# Patient Record
Sex: Male | Born: 1980 | Race: White | Hispanic: No | Marital: Married | Smoking: Never smoker
Health system: Southern US, Community
[De-identification: ages and names within clinical notes are randomized; demographics above are authoritative.]

## PROBLEM LIST (undated history)

## (undated) DIAGNOSIS — I469 Cardiac arrest, cause unspecified: Secondary | ICD-10-CM

## (undated) DIAGNOSIS — Z95810 Presence of automatic (implantable) cardiac defibrillator: Secondary | ICD-10-CM

## (undated) DIAGNOSIS — K279 Peptic ulcer, site unspecified, unspecified as acute or chronic, without hemorrhage or perforation: Secondary | ICD-10-CM

## (undated) DIAGNOSIS — I509 Heart failure, unspecified: Secondary | ICD-10-CM

## (undated) DIAGNOSIS — T827XXA Infection and inflammatory reaction due to other cardiac and vascular devices, implants and grafts, initial encounter: Secondary | ICD-10-CM

## (undated) HISTORY — PX: PACEMAKER INSERTION: SHX728

---

## 2004-11-19 DIAGNOSIS — I469 Cardiac arrest, cause unspecified: Secondary | ICD-10-CM

## 2004-11-19 HISTORY — DX: Cardiac arrest, cause unspecified: I46.9

## 2011-12-24 ENCOUNTER — Encounter (HOSPITAL_BASED_OUTPATIENT_CLINIC_OR_DEPARTMENT_OTHER): Payer: Self-pay | Admitting: *Deleted

## 2011-12-24 ENCOUNTER — Emergency Department (INDEPENDENT_AMBULATORY_CARE_PROVIDER_SITE_OTHER): Payer: BC Managed Care – PPO

## 2011-12-24 DIAGNOSIS — M25539 Pain in unspecified wrist: Secondary | ICD-10-CM | POA: Insufficient documentation

## 2011-12-24 DIAGNOSIS — M65849 Other synovitis and tenosynovitis, unspecified hand: Secondary | ICD-10-CM | POA: Insufficient documentation

## 2011-12-24 DIAGNOSIS — M65839 Other synovitis and tenosynovitis, unspecified forearm: Secondary | ICD-10-CM | POA: Insufficient documentation

## 2011-12-24 NOTE — ED Notes (Signed)
C/o left wrist pain since earlier this afternoon, denies injury

## 2011-12-25 ENCOUNTER — Emergency Department (HOSPITAL_BASED_OUTPATIENT_CLINIC_OR_DEPARTMENT_OTHER)
Admission: EM | Admit: 2011-12-25 | Discharge: 2011-12-25 | Disposition: A | Discharge status: Home / Self Care | Payer: BC Managed Care – PPO | Attending: Emergency Medicine | Admitting: Emergency Medicine

## 2011-12-25 DIAGNOSIS — M778 Other enthesopathies, not elsewhere classified: Secondary | ICD-10-CM

## 2011-12-25 HISTORY — DX: Cardiac arrest, cause unspecified: I46.9

## 2011-12-25 MED ORDER — NAPROXEN SODIUM 550 MG PO TABS: ORAL_TABLET | ORAL | Status: AC

## 2011-12-25 MED ORDER — NAPROXEN 250 MG PO TABS
500.0000 mg | ORAL_TABLET | Freq: Once | ORAL | Status: AC
  Administered 2011-12-25: 500 mg via ORAL
  Filled 2011-12-25: qty 2

## 2011-12-25 NOTE — ED Provider Notes (Signed)
History     CSN: [id number]  Arrival date & time 2012/01/10  2308   First MD Initiated Contact with Patient 12/25/11 0136      Chief Complaint  Patient presents with  . Wrist Pain    (Consider location/radiation/quality/duration/timing/severity/associated sxs/prior treatment) HPI Is a 31 year old white male with a complaint of pain in his left wrist. It has been occurring intermittently for an unspecified time but worsened yesterday. It is worse with lifting, although he denies a specific injury. The pain is minimal at the present time but was moderate to severe at its worst. It is located primarily dorsally at the crease. There is no associated numbness, motor deficit or functional deficit.  Past Medical History  Diagnosis Date  . Cardiac arrest     Past Surgical History  Procedure Date  . Pacemaker insertion     History reviewed. No pertinent family history.  History  Substance Use Topics  . Smoking status: Never Smoker   . Smokeless tobacco: Not on file  . Alcohol Use: No      Review of Systems  All other systems reviewed and are negative.    Allergies  Codeine and Vecuronium  Home Medications  No current outpatient prescriptions on file.  BP 140/95  Pulse 84  Temp(Src) 98.7 F (37.1 C) (Oral)  Resp 18  Ht 6\' 2"  (1.88 m)  Wt 275 lb (124.739 kg)  BMI 35.31 kg/m2  SpO2 100%  Physical Exam General: Well-developed, well-nourished male in no acute distress; appearance consistent with age of record HENT: normocephalic, atraumatic Eyes: Normal appearance Neck: supple Heart: regular rate and rhythm Lungs: Normal respiratory effort and excursion Abdomen: soft; nondistended Extremities: No deformity; full range of motion; pulses normal; mild tenderness on deep palpation of the dorsal left wrist at the flexure; no deficit of tendon function, motor function or sensation in left hand Neurologic: Awake, alert and oriented; motor function intact in all  extremities and symmetric; no facial droop Skin: Warm and dry Psychiatric: Normal mood and affect    ED Course  Procedures (including critical care time)     MDM  Nursing notes and vitals signs, including pulse oximetry, reviewed.  Summary of this visit's results, reviewed by myself:   Imaging Studies: Dg Wrist Complete Left  01-10-2012  *RADIOLOGY REPORT*  Clinical Data: Pain without trauma  LEFT WRIST - COMPLETE 3+ VIEW  Comparison: None.  Findings: Carpal rows intact. Negative for fracture, dislocation, or other acute abnormality.  Normal alignment and mineralization. No significant degenerative change.  Regional soft tissues unremarkable.  IMPRESSION:  Negative  Original Report Authenticated By: Osa Craver, M.D.            Hanley Seamen, MD 12/25/11 [phone]

## 2011-12-25 NOTE — ED Notes (Signed)
Dr. Molpus at bedside. 

## 2013-05-02 IMAGING — CR DG WRIST COMPLETE 3+V*L*
4 series · 4 of 4 positions shown · non-contrast
Comparison: None.

CLINICAL DATA: Pain without trauma

LEFT WRIST - COMPLETE 3+ VIEW

[x wrist pa left]
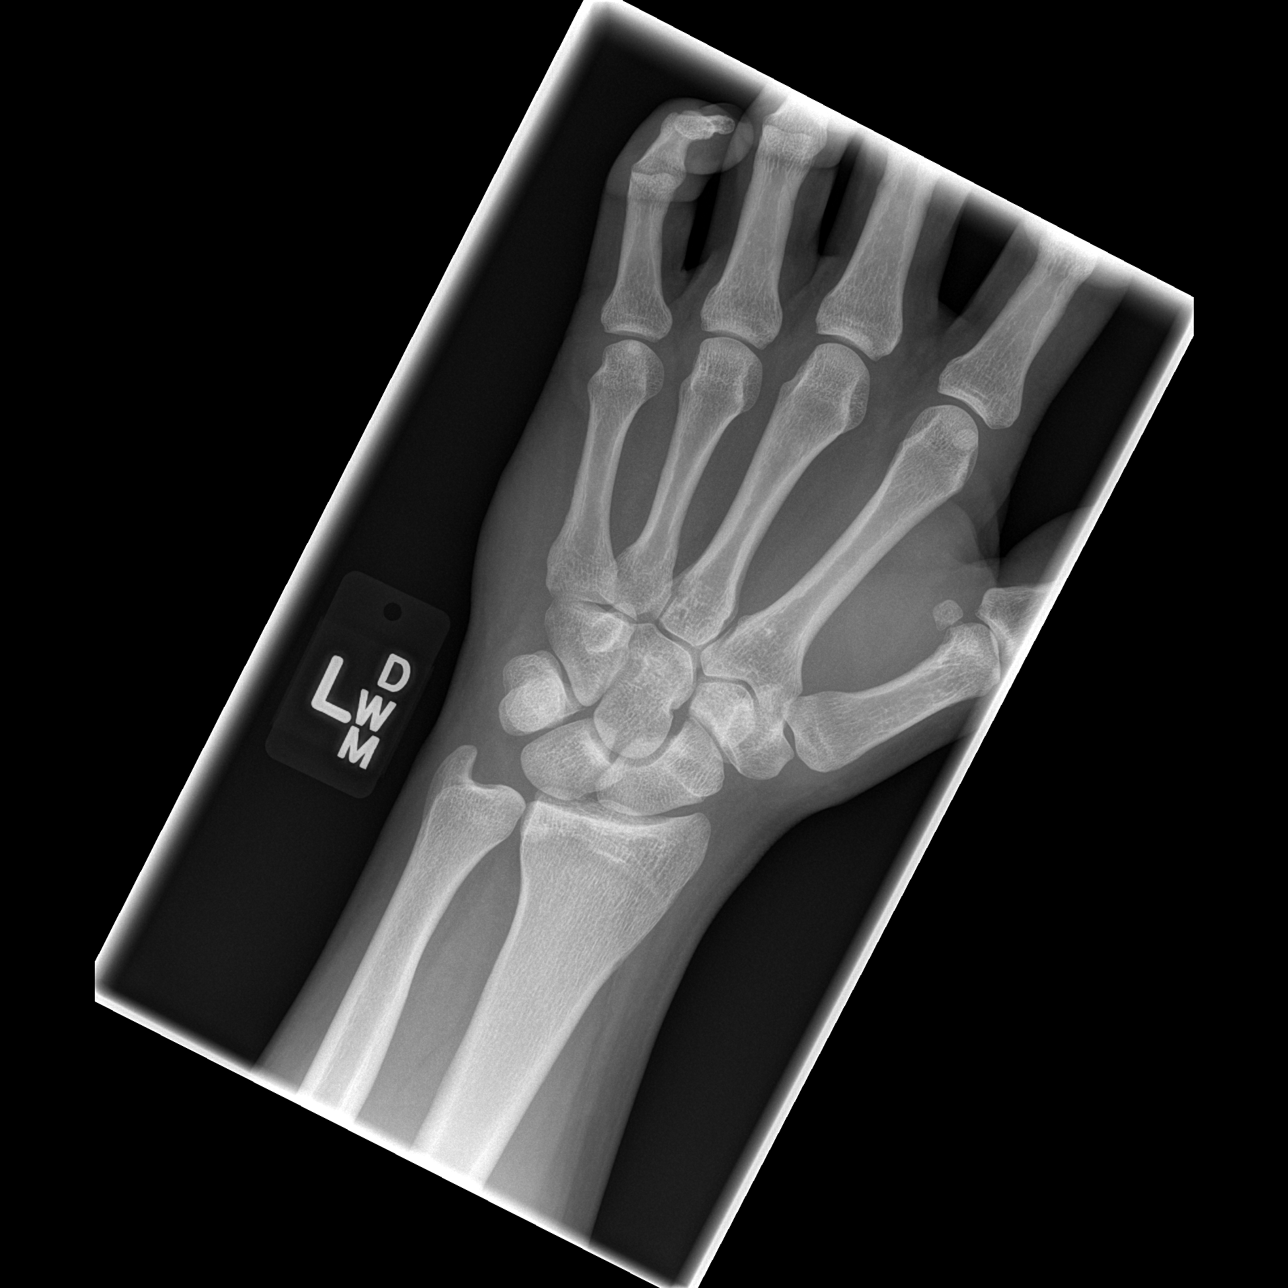

[x wrist obl left]
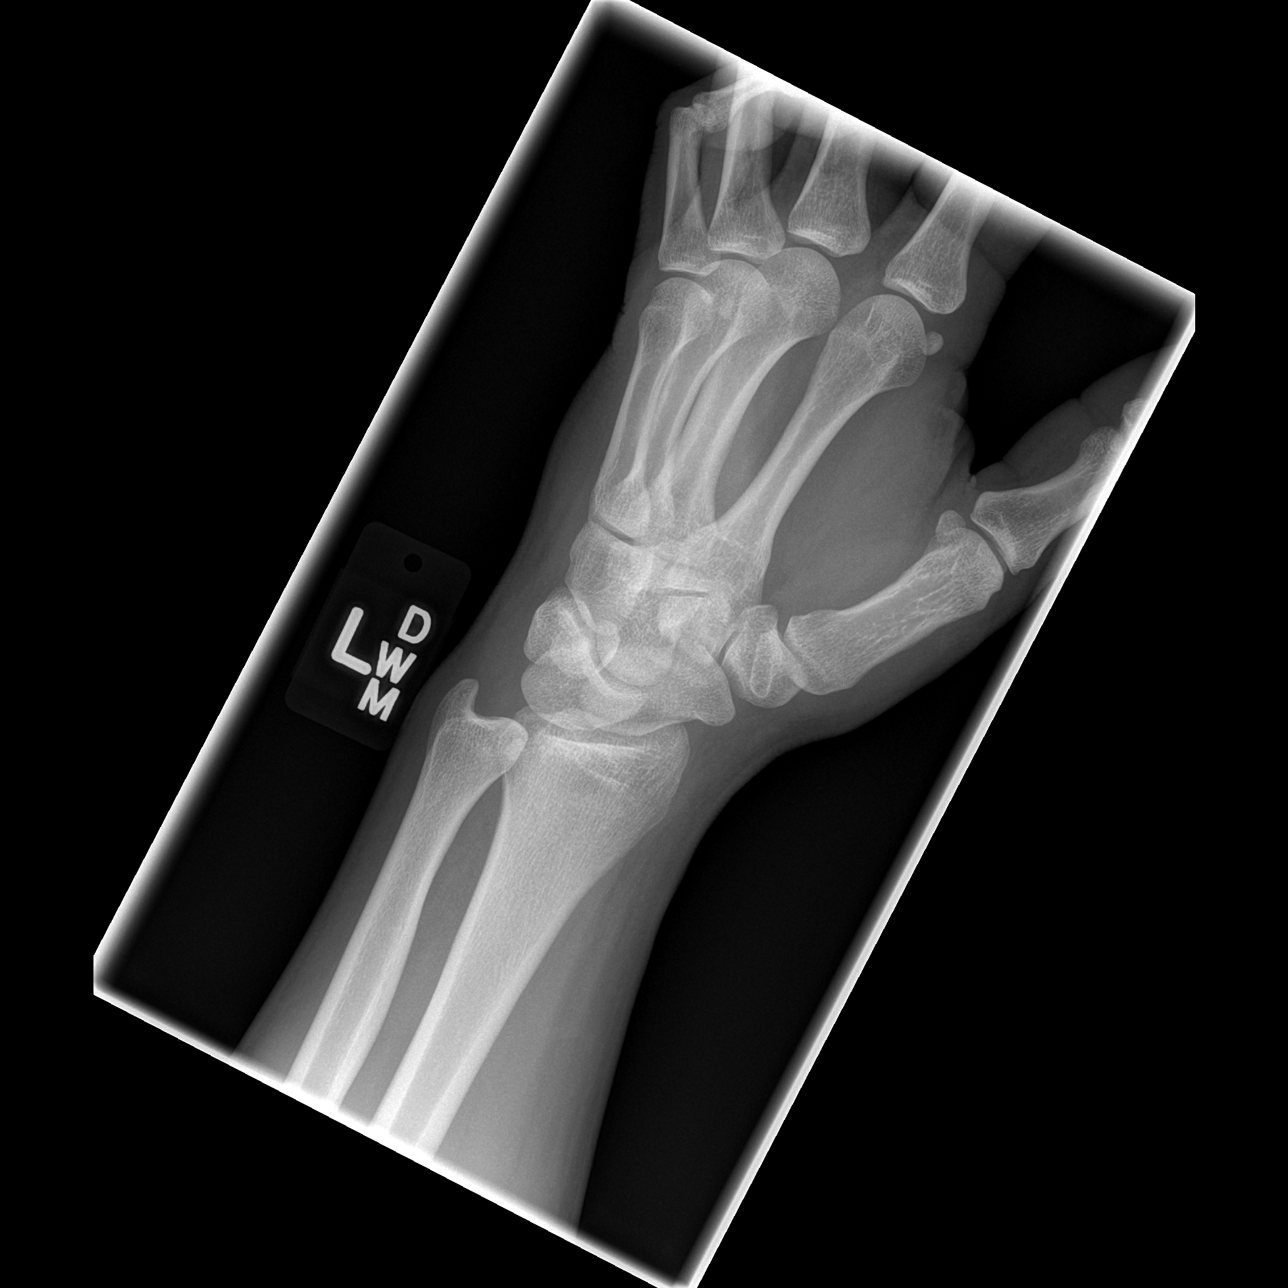

[x wrist lat left]
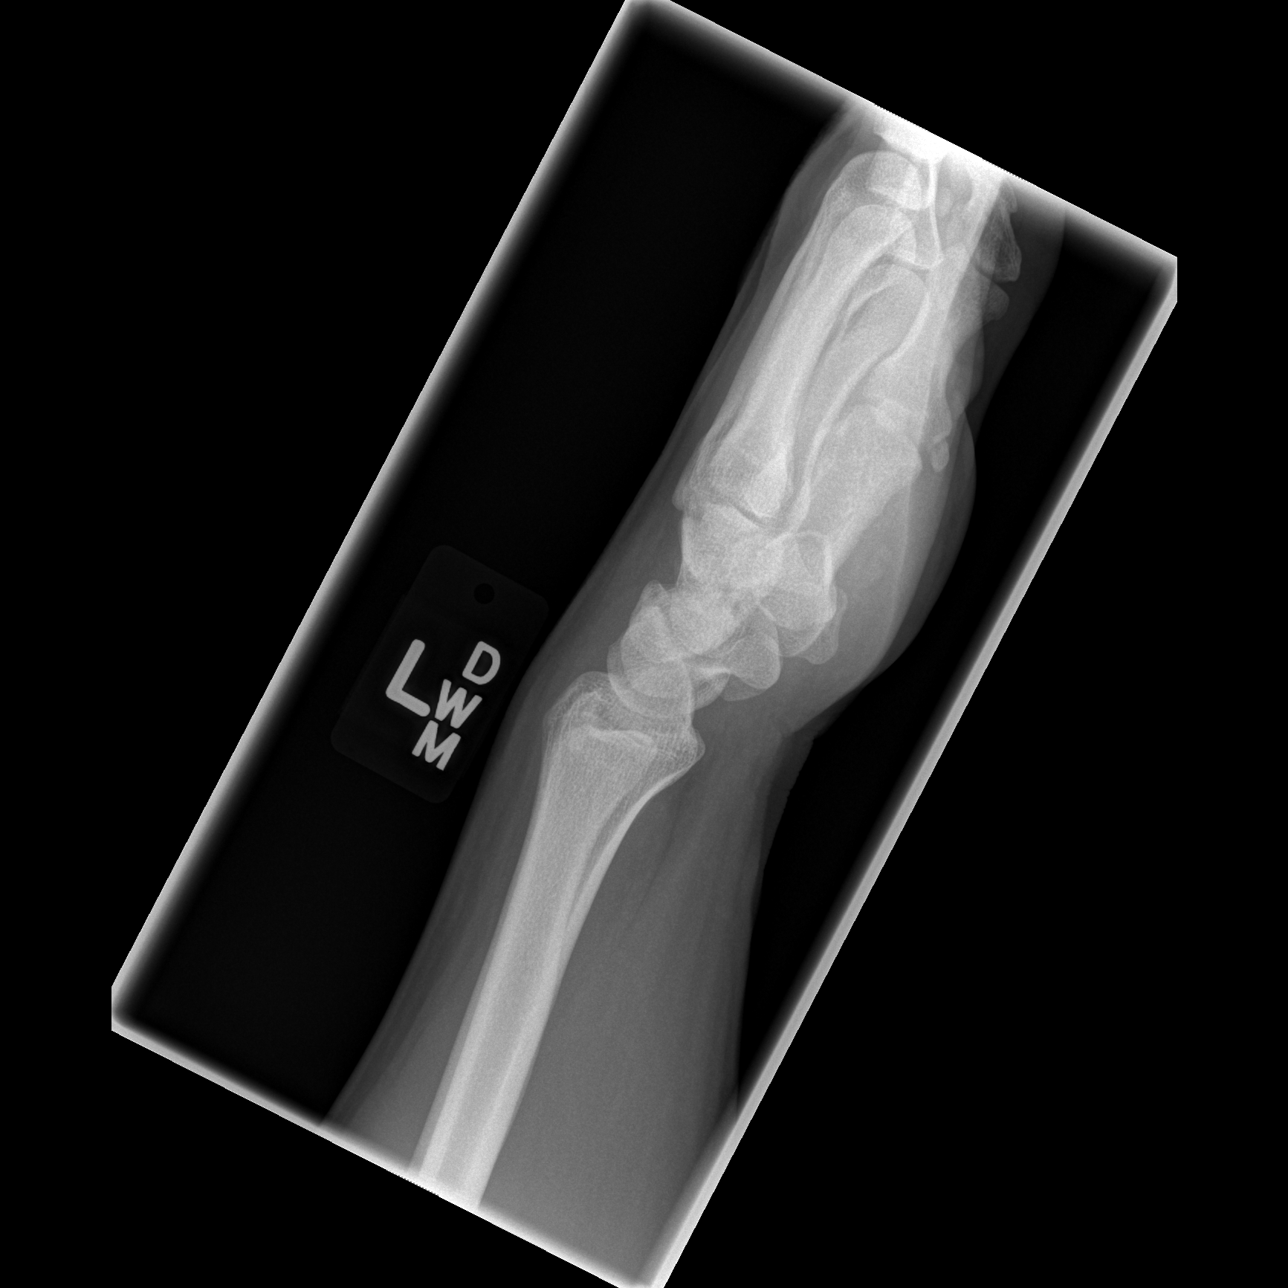

[x navicular]
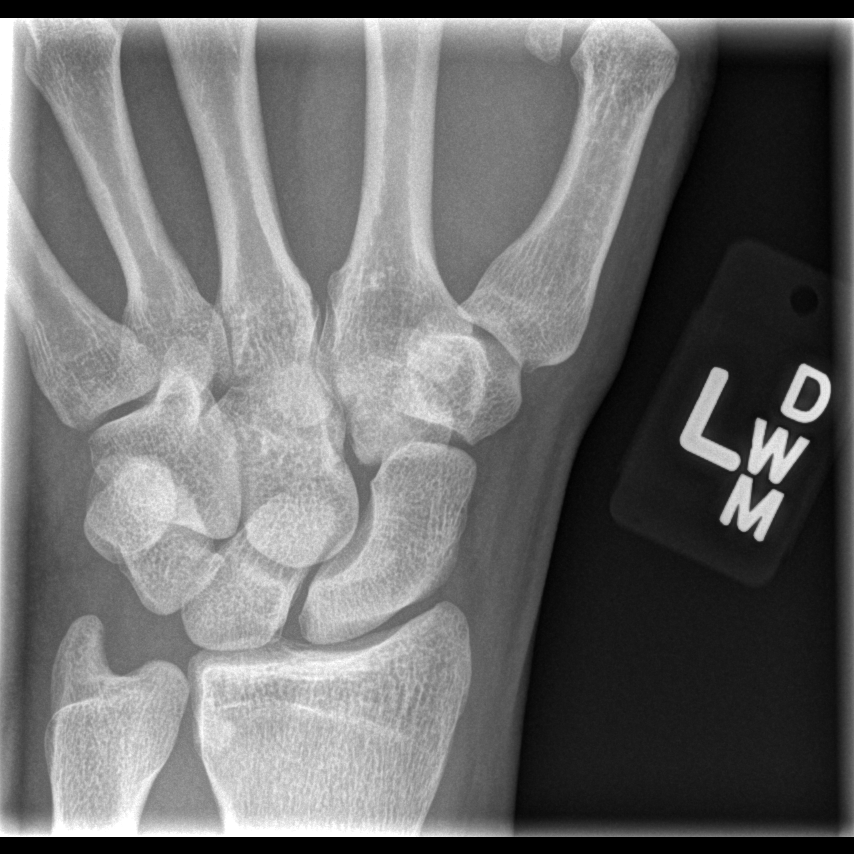

[4 of 4 positions shown; findings below may reference images not displayed]

FINDINGS: Carpal rows intact. Negative for fracture, dislocation,
or other acute abnormality.  Normal alignment and mineralization.
No significant degenerative change.  Regional soft tissues
unremarkable.
IMPRESSION: Negative

## 2014-11-19 HISTORY — PX: OTHER SURGICAL HISTORY: SHX169

## 2014-11-19 HISTORY — PX: THORACOTOMY: SUR1349

## 2017-04-09 ENCOUNTER — Encounter (HOSPITAL_BASED_OUTPATIENT_CLINIC_OR_DEPARTMENT_OTHER): Payer: Self-pay | Admitting: Emergency Medicine

## 2017-04-09 ENCOUNTER — Emergency Department (HOSPITAL_BASED_OUTPATIENT_CLINIC_OR_DEPARTMENT_OTHER): Payer: Medicaid Other

## 2017-04-09 ENCOUNTER — Emergency Department (HOSPITAL_BASED_OUTPATIENT_CLINIC_OR_DEPARTMENT_OTHER)
Admission: EM | Admit: 2017-04-09 | Discharge: 2017-04-09 | Disposition: A | Discharge status: Home / Self Care | Payer: Medicaid Other | Attending: Emergency Medicine | Admitting: Emergency Medicine

## 2017-04-09 DIAGNOSIS — S1011XA Abrasion of throat, initial encounter: Secondary | ICD-10-CM | POA: Diagnosis not present

## 2017-04-09 DIAGNOSIS — Y929 Unspecified place or not applicable: Secondary | ICD-10-CM | POA: Diagnosis not present

## 2017-04-09 DIAGNOSIS — X58XXXA Exposure to other specified factors, initial encounter: Secondary | ICD-10-CM | POA: Insufficient documentation

## 2017-04-09 DIAGNOSIS — S0993XA Unspecified injury of face, initial encounter: Secondary | ICD-10-CM | POA: Diagnosis present

## 2017-04-09 DIAGNOSIS — R0989 Other specified symptoms and signs involving the circulatory and respiratory systems: Secondary | ICD-10-CM | POA: Diagnosis not present

## 2017-04-09 DIAGNOSIS — Y939 Activity, unspecified: Secondary | ICD-10-CM | POA: Diagnosis not present

## 2017-04-09 DIAGNOSIS — Y999 Unspecified external cause status: Secondary | ICD-10-CM | POA: Diagnosis not present

## 2017-04-09 MED ORDER — BENZOCAINE 20 % MT AERO
INHALATION_SPRAY | Freq: Once | OROMUCOSAL | Status: AC
  Administered 2017-04-09: 1 via OROMUCOSAL
  Filled 2017-04-09: qty 57

## 2017-04-09 MED ORDER — DEXAMETHASONE 6 MG PO TABS
10.0000 mg | ORAL_TABLET | Freq: Once | ORAL | Status: AC
  Administered 2017-04-09: 10 mg via ORAL
  Filled 2017-04-09: qty 1

## 2017-04-09 MED ORDER — LIDOCAINE HCL 2 % EX GEL
1.0000 "application " | Freq: Once | CUTANEOUS | Status: AC
  Administered 2017-04-09: 1
  Filled 2017-04-09: qty 20

## 2017-04-09 MED ORDER — HYDROCODONE-ACETAMINOPHEN 5-325 MG PO TABS: 2.0000 | ORAL_TABLET | Freq: Once | ORAL | Status: DC

## 2017-04-09 MED ORDER — OXYMETAZOLINE HCL 0.05 % NA SOLN
1.0000 | Freq: Once | NASAL | Status: AC
  Administered 2017-04-09: 1 via NASAL

## 2017-04-09 MED ORDER — GI COCKTAIL ~~LOC~~
30.0000 mL | Freq: Once | ORAL | Status: AC
  Administered 2017-04-09: 30 mL via ORAL
  Filled 2017-04-09: qty 30

## 2017-04-09 MED ORDER — HYDROCODONE-ACETAMINOPHEN 7.5-325 MG/15ML PO SOLN: 10.0000 mL | Freq: Four times a day (QID) | ORAL | 0 refills | Status: DC | PRN

## 2017-04-09 MED ORDER — OXYMETAZOLINE HCL 0.05 % NA SOLN
NASAL | Status: AC
  Filled 2017-04-09: qty 15

## 2017-04-09 MED FILL — HYDROCOD-APAP 7.5-325/15ML: 7.5-325 | 3 days supply | Qty: 120 | Fill #0

## 2017-04-09 NOTE — ED Triage Notes (Signed)
Pt states he was eating bacon this morning when he choked. Now feels like something is stuck in his throat. He is able to swallow his saliva. Denies SOB.

## 2017-04-09 NOTE — ED Provider Notes (Signed)
[facility]   CSN: [id number] Arrival date & time: 04/09/17  4098     History   Chief Complaint Chief Complaint  Patient presents with  . Aspiration    HPI Robert Rivers is a 36 y.o. male.  HPI   36 year old male with past medical history of pacemaker placement following suspected arrhythmia arrest, otherwise healthy, who presents with sore throat and foreign body sensation. The patient was eating a bacon sandwich earlier today. He states that he attempted to swallow and felt like a piece of bacon scratched the left side of his throat. Since then, he has had a sharp, stabbing pain in his left throat that is worse with any swallowing. He has had no associated vomiting. No coughing or wheezing. He has been able to swallow without difficulty, but states it is moderately painful. Denies any pain in his neck. He has no history of similar symptoms. No history of dysphasia or GERD. No other medical complaints.  Past Medical History:  Diagnosis Date  . Cardiac arrest (HCC)     There are no active problems to display for this patient.   Past Surgical History:  Procedure Laterality Date  . PACEMAKER INSERTION         Home Medications    Prior to Admission medications   Medication Sig Start Date End Date Taking? Authorizing Provider  HYDROcodone-acetaminophen (HYCET) 7.5-325 mg/15 ml solution Take 10 mLs by mouth every 6 (six) hours as needed for moderate pain. 04/09/17 04/09/18  Shaune Pollack, MD  naproxen sodium (ANAPROX DS) 550 MG tablet Take 1 twice daily as needed for wrist pain. 12/25/11   Molpus, John, MD    Family History No family history on file.  Social History Social History  Substance Use Topics  . Smoking status: Never Smoker  . Smokeless tobacco: Never Used  . Alcohol use No     Allergies   Codeine and Vecuronium   Review of Systems Review of Systems  Constitutional: Negative for chills, fatigue and fever.  HENT: Positive  for sore throat and trouble swallowing. Negative for congestion and rhinorrhea.   Eyes: Negative for visual disturbance.  Respiratory: Negative for cough, shortness of breath and wheezing.   Cardiovascular: Negative for chest pain and leg swelling.  Gastrointestinal: Negative for abdominal pain, diarrhea, nausea and vomiting.  Genitourinary: Negative for dysuria and flank pain.  Musculoskeletal: Negative for neck pain and neck stiffness.  Skin: Negative for rash and wound.  Allergic/Immunologic: Negative for immunocompromised state.  Neurological: Negative for syncope, weakness and headaches.  All other systems reviewed and are negative.    Physical Exam Updated Vital Signs BP 111/88 (BP Location: Right Arm)   Pulse 72   Temp 97.8 F (36.6 C) (Oral)   Resp 16   Ht 6' (1.829 m)   Wt 124.7 kg (275 lb)   SpO2 99%   BMI 37.30 kg/m   Physical Exam  Constitutional: He is oriented to person, place, and time. He appears well-developed and well-nourished. No distress.  HENT:  Head: Normocephalic and atraumatic.  Mild posterior pharyngeal erythema. No visible foreign body. No sublingual foreign bodies or tenderness. No pooling of secretions.  Eyes: Conjunctivae are normal.  Neck: Neck supple.  No tenderness with manipulation of the trachea. No crepitance. Normal, full painless range of motion.  Cardiovascular: Normal rate, regular rhythm and normal heart sounds.  Exam reveals no friction rub.   No murmur heard. Pulmonary/Chest: Effort normal and breath sounds normal. No  respiratory distress. He has no wheezes. He has no rales.  Abdominal: He exhibits no distension.  Musculoskeletal: He exhibits no edema.  Neurological: He is alert and oriented to person, place, and time. He exhibits normal muscle tone.  Skin: Skin is warm. Capillary refill takes less than 2 seconds.  Psychiatric: He has a normal mood and affect.  Nursing note and vitals reviewed.    ED Treatments / Results   Labs (all labs ordered are listed, but only abnormal results are displayed) Labs Reviewed - No data to display  EKG  EKG Interpretation None       Radiology Dg Neck Soft Tissue  Result Date: 04/09/2017 CLINICAL DATA:  Choked this morning on a piece of bacon question aspiration, feels something scratchy throat EXAM: NECK SOFT TISSUES - 1+ VIEW COMPARISON:  None FINDINGS: Prevertebral soft tissues normal thickness. Epiglottis and aryepiglottic folds normal appearance. No radiopaque foreign bodies. Visualized osseous structures unremarkable. Airway appears patent. IMPRESSION: Normal exam. Electronically Signed   By: Ulyses SouthwardMark  Boles M.D.   On: 04/09/2017 09:35    Procedures Fiberoptic laryngoscopy Date/Time: 04/09/2017 3:20 PM Performed by: Shaune PollackISAACS, Nijah Orlich Authorized by: Shaune PollackISAACS, Gurkirat Basher  Consent: Verbal consent obtained. Risks and benefits: risks, benefits and alternatives were discussed Consent given by: patient Required items: required blood products, implants, devices, and special equipment available Patient identity confirmed: arm band Time out: Immediately prior to procedure a "time out" was called to verify the correct patient, procedure, equipment, support staff and site/side marked as required. Local anesthesia used: yes  Anesthesia: Local anesthesia used: yes  Sedation: Patient sedated: no Patient tolerance: Patient tolerated the procedure well with no immediate complications Comments: Transnasal flexible fiberoptic laryngoscopy performed by myself. The patient was locally anesthetized with lidocaine jelly and benzocaine. The scope was introduced atraumatically and the pharynx was visualized. There does appear to be mild erythema and a small, streaking abrasion on the left hypopharynx posterior to the piriformis sinus. There is no apparent foreign body. There is mild edema in this area but airway is widely patent with normal vocal cord movement.    (including critical care  time)  Medications Ordered in ED Medications  lidocaine (XYLOCAINE) 2 % jelly 1 application (1 application Other Given 04/09/17 0927)  Benzocaine (HURRCAINE) 20 % mouth spray (1 application Mouth/Throat Given 04/09/17 0927)  gi cocktail (Maalox,Lidocaine,Donnatal) (30 mLs Oral Given 04/09/17 0926)  oxymetazoline (AFRIN) 0.05 % nasal spray 1 spray (1 spray Each Nare Given 04/09/17 0929)  dexamethasone (DECADRON) tablet 10 mg (10 mg Oral Given 04/09/17 1015)        Initial Impression / Assessment and Plan / ED Course  I have reviewed the triage vital signs and the nursing notes.  Pertinent labs & imaging results that were available during my care of the patient were reviewed by me and considered in my medical decision making (see chart for details).    36 year old male here with foreign body sensation after eating a sharp piece of bacon this morning. Exam as above. On fiberoptic laryngoscopy, patient does have an abrasion consistent with likely local tissue trauma during swallowing episode. There is no apparent aspiration with no cough, wheezing, or evidence to suggest airway involvement. His plain film of the neck is negative. Starting by mouth without difficulty. No evidence of significant aspiration or airway trauma. Will d/c with supportive care   Final Clinical Impressions(s) / ED Diagnoses   Final diagnoses:  Foreign body sensation in throat  Abrasion of throat, initial encounter  New Prescriptions Discharge Medication List as of 04/09/2017 10:03 AM    START taking these medications   Details  HYDROcodone-acetaminophen (HYCET) 7.5-325 mg/15 ml solution Take 10 mLs by mouth every 6 (six) hours as needed for moderate pain., Starting Tue 04/09/2017, Until Wed 04/09/2018, Print         Shaune Pollack, MD 04/09/17 [phone]

## 2017-04-09 NOTE — ED Notes (Signed)
ED Provider at bedside. 

## 2017-05-05 ENCOUNTER — Emergency Department (HOSPITAL_BASED_OUTPATIENT_CLINIC_OR_DEPARTMENT_OTHER)
Admission: EM | Admit: 2017-05-05 | Discharge: 2017-05-05 | Disposition: A | Discharge status: Home / Self Care | Payer: Medicaid Other | Attending: Emergency Medicine | Admitting: Emergency Medicine

## 2017-05-05 ENCOUNTER — Emergency Department (HOSPITAL_BASED_OUTPATIENT_CLINIC_OR_DEPARTMENT_OTHER): Payer: Medicaid Other

## 2017-05-05 ENCOUNTER — Encounter (HOSPITAL_BASED_OUTPATIENT_CLINIC_OR_DEPARTMENT_OTHER): Payer: Self-pay | Admitting: *Deleted

## 2017-05-05 DIAGNOSIS — R1013 Epigastric pain: Secondary | ICD-10-CM | POA: Diagnosis present

## 2017-05-05 DIAGNOSIS — R110 Nausea: Secondary | ICD-10-CM | POA: Insufficient documentation

## 2017-05-05 DIAGNOSIS — Z95810 Presence of automatic (implantable) cardiac defibrillator: Secondary | ICD-10-CM | POA: Insufficient documentation

## 2017-05-05 DIAGNOSIS — I509 Heart failure, unspecified: Secondary | ICD-10-CM | POA: Insufficient documentation

## 2017-05-05 HISTORY — DX: Peptic ulcer, site unspecified, unspecified as acute or chronic, without hemorrhage or perforation: K27.9

## 2017-05-05 HISTORY — DX: Cardiac arrest, cause unspecified: I46.9

## 2017-05-05 HISTORY — DX: Heart failure, unspecified: I50.9

## 2017-05-05 HISTORY — DX: Infection and inflammatory reaction due to other cardiac and vascular devices, implants and grafts, initial encounter: T82.7XXA

## 2017-05-05 HISTORY — DX: Presence of automatic (implantable) cardiac defibrillator: Z95.810

## 2017-05-05 LAB — CBC
HCT: 42.8 % (ref 39.0–52.0)
Hemoglobin: 14.8 g/dL (ref 13.0–17.0)
MCH: 28.4 pg (ref 26.0–34.0)
MCHC: 34.6 g/dL (ref 30.0–36.0)
MCV: 82 fL (ref 78.0–100.0)
PLATELETS: 286 10*3/uL (ref 150–400)
RBC: 5.22 MIL/uL (ref 4.22–5.81)
RDW: 12.7 % (ref 11.5–15.5)
WBC: 8.2 10*3/uL (ref 4.0–10.5)

## 2017-05-05 LAB — COMPREHENSIVE METABOLIC PANEL
ALT: 33 U/L (ref 17–63)
AST: 25 U/L (ref 15–41)
Albumin: 4.4 g/dL (ref 3.5–5.0)
Alkaline Phosphatase: 53 U/L (ref 38–126)
Anion gap: 8 (ref 5–15)
BUN: 22 mg/dL — AB (ref 6–20)
CHLORIDE: 102 mmol/L (ref 101–111)
CO2: 27 mmol/L (ref 22–32)
CREATININE: 0.84 mg/dL (ref 0.61–1.24)
Calcium: 9.3 mg/dL (ref 8.9–10.3)
Glucose, Bld: 103 mg/dL — ABNORMAL HIGH (ref 65–99)
Potassium: 4 mmol/L (ref 3.5–5.1)
SODIUM: 137 mmol/L (ref 135–145)
Total Bilirubin: 0.7 mg/dL (ref 0.3–1.2)
Total Protein: 7.3 g/dL (ref 6.5–8.1)

## 2017-05-05 LAB — URINALYSIS, ROUTINE W REFLEX MICROSCOPIC
BILIRUBIN URINE: NEGATIVE
GLUCOSE, UA: NEGATIVE mg/dL
HGB URINE DIPSTICK: NEGATIVE
KETONES UR: NEGATIVE mg/dL
Leukocytes, UA: NEGATIVE
Nitrite: NEGATIVE
PROTEIN: NEGATIVE mg/dL
Specific Gravity, Urine: 1.03 (ref 1.005–1.030)
pH: 6 (ref 5.0–8.0)

## 2017-05-05 LAB — TROPONIN I

## 2017-05-05 LAB — LIPASE, BLOOD: LIPASE: 32 U/L (ref 11–51)

## 2017-05-05 MED ORDER — PANTOPRAZOLE SODIUM 20 MG PO TBEC: 20.0000 mg | DELAYED_RELEASE_TABLET | Freq: Every day | ORAL | 1 refills | Status: AC

## 2017-05-05 MED ORDER — FAMOTIDINE 20 MG PO TABS: 20.0000 mg | ORAL_TABLET | Freq: Two times a day (BID) | ORAL | 0 refills | Status: AC

## 2017-05-05 MED ORDER — PANTOPRAZOLE SODIUM 40 MG IV SOLR
40.0000 mg | Freq: Once | INTRAVENOUS | Status: AC
  Administered 2017-05-05: 40 mg via INTRAVENOUS
  Filled 2017-05-05: qty 40

## 2017-05-05 MED ORDER — GI COCKTAIL ~~LOC~~
30.0000 mL | Freq: Once | ORAL | Status: AC
  Administered 2017-05-05: 30 mL via ORAL
  Filled 2017-05-05: qty 30

## 2017-05-05 MED ORDER — FAMOTIDINE IN NACL 20-0.9 MG/50ML-% IV SOLN
20.0000 mg | Freq: Once | INTRAVENOUS | Status: AC
  Administered 2017-05-05: 20 mg via INTRAVENOUS
  Filled 2017-05-05: qty 50

## 2017-05-05 NOTE — ED Notes (Signed)
ED Provider at bedside. 

## 2017-05-05 NOTE — ED Notes (Signed)
Pt given Rx x 2 for protonix and pepcid

## 2017-05-05 NOTE — ED Triage Notes (Signed)
Pt c/o upper abd pain worse with eating x 4 days

## 2017-05-05 NOTE — ED Provider Notes (Signed)
[facility]   CSN: [id number] Arrival date & time: 05/05/17  1048     History   Chief Complaint Chief Complaint  Patient presents with  . Abdominal Pain    HPI Robert GrinderJason P Rivers is a 36 y.o. male.  HPI   Robert GrinderJason P Shrewsbury is a [age] y.o. male, with a history of SCD and AICD, presenting to the ED with epigastric pain beginning four days ago. States he was just relaxing around the house. At rest, pain is a dull ache, worsens to a sharp intense pain with movement or eating. Currently 2-3/10, increases to 6-7/10 with movement or walking. Occasional nausea and premature fullness with eating.  About 12 years ago, had an episode of SCD with subsequent ICU [facility] a [facility] with pacemaker/ACID insertion, replaced with an AICD 2 years ago.  Also endorses history of PUD about 8-10 years ago, but states his current symptoms are not similar to his symptoms then. He does not follow with a GI specialist for this and is not on PPI or H2 blocker.  Endorses only occasional NSAID or alcohol use. No tobacco use.   Denies vomiting/diarrhea/constipation, hematochezia/melena, shortness of breath, chest pain, fever/chills, diaphoresis, or any other complaints.    Past Medical History:  Diagnosis Date  . Cardiac arrest (HCC)   . Heart failure (HCC)    EF 40-45%  . ICD (implantable cardioverter-defibrillator) in place   . ICD (implantable cardioverter-defibrillator) infection (HCC)   . PUD (peptic ulcer disease)   . Sudden cardiac death [facility](HCC) 2006   Unknown etiology    There are no active problems to display for this patient.   Past Surgical History:  Procedure Laterality Date  . ICD replacement  2016  . PACEMAKER INSERTION    . THORACOTOMY  2016   To facilitate ICD lead removal       Home Medications    Prior to Admission medications   Medication Sig Start Date End Date Taking? Authorizing Provider  famotidine (PEPCID) 20 MG tablet Take 1 tablet (20  mg total) by mouth 2 (two) times daily. 05/05/17 05/10/17  Joy, Hillard DankerShawn C, PA-C  HYDROcodone-acetaminophen (HYCET) 7.5-325 mg/15 ml solution Take 10 mLs by mouth every 6 (six) hours as needed for moderate pain. 04/09/17 04/09/18  Shaune PollackIsaacs, Cameron, MD  naproxen sodium (ANAPROX DS) 550 MG tablet Take 1 twice daily as needed for wrist pain. 12/25/11   Molpus, John, MD  pantoprazole (PROTONIX) 20 MG tablet Take 1 tablet (20 mg total) by mouth daily before breakfast. 05/05/17 07/04/17  Joy, Hillard DankerShawn C, PA-C    Family History History reviewed. No pertinent family history.  Social History Social History  Substance Use Topics  . Smoking status: Never Smoker  . Smokeless tobacco: Never Used  . Alcohol use No     Allergies   Codeine and Vecuronium   Review of Systems Review of Systems  Constitutional: Negative for chills, diaphoresis and fever.  Respiratory: Negative for shortness of breath.   Cardiovascular: Negative for chest pain.  Gastrointestinal: Positive for abdominal pain and nausea. Negative for blood in stool, constipation, diarrhea and vomiting.  Musculoskeletal: Negative for back pain.  All other systems reviewed and are negative.    Physical Exam Updated Vital Signs BP 118/74 (BP Location: Right Arm)   Pulse 78   Temp 98 F (36.7 C) (Oral)   Resp 18   Ht 6\' 2"  (1.88 m)   Wt 124.7 kg (275 lb)   SpO2  98%   BMI 35.31 kg/m   Physical Exam  Constitutional: He appears well-developed and well-nourished. No distress.  HENT:  Head: Normocephalic and atraumatic.  Mouth/Throat: Oropharynx is clear and moist.  Eyes: Conjunctivae are normal.  Neck: Neck supple.  Cardiovascular: Normal rate, regular rhythm, normal heart sounds and intact distal pulses.   Pulmonary/Chest: Effort normal and breath sounds normal. No respiratory distress.  Abdominal: Soft. There is tenderness in the epigastric area. There is no guarding.  Musculoskeletal: He exhibits no edema.  Lymphadenopathy:    He has  no cervical adenopathy.  Neurological: He is alert.  Skin: Skin is warm and dry. He is not diaphoretic.  Psychiatric: He has a normal mood and affect. His behavior is normal.  Nursing note and vitals reviewed.    ED Treatments / Results  Labs (all labs ordered are listed, but only abnormal results are displayed) Labs Reviewed  COMPREHENSIVE METABOLIC PANEL - Abnormal; Notable for the following:       Result Value   Glucose, Bld 103 (*)    BUN 22 (*)    All other components within normal limits  LIPASE, BLOOD  CBC  URINALYSIS, ROUTINE W REFLEX MICROSCOPIC  TROPONIN I    EKG  EKG Interpretation  Date/Time:  Sunday May 05 2017 12:22:20 EDT Ventricular Rate:  73 PR Interval:    QRS Duration: 85 QT Interval:  387 QTC Calculation: 427 R Axis:   55 Text Interpretation:  Sinus rhythm Probable left atrial enlargement ST elev, probable normal early repol pattern Baseline wander in lead(s) V1 Confirmed by Lincoln Brigham [phone]) on 05/05/2017 12:24:52 PM       Radiology US Abdomen Limited  Result Date: 05/05/2017 CLINICAL DATA:  Epigastric and right upper quadrant abdominal pain for several days EXAM: ULTRASOUND ABDOMEN LIMITED RIGHT UPPER QUADRANT COMPARISON:  None. FINDINGS: Gallbladder: No gallstones or wall thickening visualized. No sonographic Murphy sign noted by sonographer. Common bile duct: Diameter: 3 mm Liver: Liver parenchyma is diffusely mildly to moderately echogenic with posterior acoustic attenuation, compatible with mild-to-moderate diffuse hepatic steatosis. No definite liver surface irregularity. No liver mass detected, noting decreased sensitivity in the setting of an echogenic liver. IMPRESSION: 1. Mild-to-moderate diffuse hepatic steatosis. 2. Normal gallbladder, with no cholelithiasis. 3. No biliary ductal dilatation. Electronically Signed   By: Delbert Phenix M.D.   On: 05/05/2017 13:22    Procedures Procedures (including critical care time)  Medications Ordered in  ED Medications  pantoprazole (PROTONIX) injection 40 mg (40 mg Intravenous Given 05/05/17 1222)  famotidine (PEPCID) IVPB 20 mg premix (0 mg Intravenous Stopped 05/05/17 1303)  gi cocktail (Maalox,Lidocaine,Donnatal) (30 mLs Oral Given 05/05/17 1221)     Initial Impression / Assessment and Plan / ED Course  I have reviewed the triage vital signs and the nursing notes.  Pertinent labs & imaging results that were available during my care of the patient were reviewed by me and considered in my medical decision making (see chart for details).     Patient presents with epigastric pain. Patient is nontoxic appearing, afebrile, not tachycardic, not tachypneic, not hypotensive, maintains SPO2 of 98-99% on room air, and is in no apparent distress. Patient has no signs of sepsis or other serious or life-threatening condition. Low suspicion for cardiac origin. No acute abnormalities on EKG. Troponin negative. Suspect PUD origin of symptoms. No other abnormalities other than hepatic steatosis on ultrasound. GI follow-up. The patient was given instructions for home care as well as strict return precautions. Patient voices  understanding of these instructions, accepts the plan, and is comfortable with discharge.   Discussed cessation of all alcohol and NSAID use. Discussed initiation of bland diet, starting with liquid diet and then progressing.   Findings and plan of care discussed with Tilden Fossa, MD.   Vitals:   05/05/17 1055 05/05/17 1306 05/05/17 1347  BP: 118/74 109/72 104/64  Pulse: 78 74 77  Resp: 18 14 17   Temp: 98 F (36.7 C)    TempSrc: Oral    SpO2: 98% 98% 99%  Weight: 124.7 kg (275 lb)    Height: 6\' 2"  (1.88 m)       Final Clinical Impressions(s) / ED Diagnoses   Final diagnoses:  Epigastric pain    New Prescriptions Discharge Medication List as of 05/05/2017  1:44 PM    START taking these medications   Details  famotidine (PEPCID) 20 MG tablet Take 1 tablet (20 mg  total) by mouth 2 (two) times daily., Starting Sun 05/05/2017, Until Fri 05/10/2017, Print    pantoprazole (PROTONIX) 20 MG tablet Take 1 tablet (20 mg total) by mouth daily before breakfast., Starting Sun 05/05/2017, Until Thu 07/04/2017, Print         Joy, Wallace C, PA-C 05/05/17 1725    Tilden Fossa, MD 05/06/17 [phone]

## 2017-05-05 NOTE — Discharge Instructions (Signed)
There were no abnormalities on the labs or imaging to explain your symptoms. Protonix: Take the Protonix daily 20-30 minutes prior to your first meal of the day. Pepcid: Take this medication twice a day for the next 5 days. Diet: Slowly advance your diet with bland foods. Liquid diet may cause less pain. Follow up: Follow up with the gastroenterologist. Call the number provided to set up an appointment. Return: Should symptoms worsen [facility] to [facility] or [facility].

## 2017-05-06 ENCOUNTER — Telehealth (HOSPITAL_BASED_OUTPATIENT_CLINIC_OR_DEPARTMENT_OTHER): Payer: Self-pay | Admitting: Emergency Medicine

## 2017-05-06 NOTE — Telephone Encounter (Signed)
Phone call made to follow up with patient after being seen by myself in the [facility] ED yesterday. Patient seems to be improving. He voices intention to initiate recommended follow up plan with specialist.

## 2017-05-10 ENCOUNTER — Emergency Department (HOSPITAL_BASED_OUTPATIENT_CLINIC_OR_DEPARTMENT_OTHER)
Admission: EM | Admit: 2017-05-10 | Discharge: 2017-05-10 | Disposition: A | Discharge status: Home / Self Care | Payer: Medicaid Other | Attending: Emergency Medicine | Admitting: Emergency Medicine

## 2017-05-10 ENCOUNTER — Encounter (HOSPITAL_BASED_OUTPATIENT_CLINIC_OR_DEPARTMENT_OTHER): Payer: Self-pay | Admitting: *Deleted

## 2017-05-10 DIAGNOSIS — I509 Heart failure, unspecified: Secondary | ICD-10-CM | POA: Diagnosis not present

## 2017-05-10 DIAGNOSIS — L089 Local infection of the skin and subcutaneous tissue, unspecified: Secondary | ICD-10-CM | POA: Diagnosis present

## 2017-05-10 DIAGNOSIS — L03012 Cellulitis of left finger: Secondary | ICD-10-CM | POA: Diagnosis not present

## 2017-05-10 DIAGNOSIS — Z95810 Presence of automatic (implantable) cardiac defibrillator: Secondary | ICD-10-CM | POA: Insufficient documentation

## 2017-05-10 MED ORDER — CEPHALEXIN 500 MG PO CAPS: 500.0000 mg | ORAL_CAPSULE | Freq: Four times a day (QID) | ORAL | 0 refills | Status: DC

## 2017-05-10 MED FILL — CEPHALEXIN 500 MG CAPSULE: 500 | 7 days supply | Qty: 28 | Fill #0

## 2017-05-10 NOTE — ED Triage Notes (Signed)
Pt reports one week of swelling and pain to left middle finger, seen at urgent on Wednesday, rx atbx, attempted I&D with no results per pt. He is on day 2 of sulfa, cont with pain and swelling.

## 2017-05-10 NOTE — ED Provider Notes (Signed)
[facility] Provider Note   CSN: [id number] Arrival date & time: 05/10/17  1021     History   Chief Complaint Chief Complaint  Patient presents with  . Skin Problem    HPI Robert Rivers is a 36 y.o. male.  Patient who is left-handed presents with continued infection to his left middle finger. Patient has had symptoms for approximately one week. Symptoms started as a mild area of swelling and redness that then worsened. Patient was seen at an urgent care 2 days ago and was started on Bactrim. He also had I&D attempted without discharge. Patient has noted intermittent mild drainage of pus from the area spontaneously. Pain is worse with movement of his finger. No swelling, redness of the hand or forearm. No fevers. He does have some mild axilla tenderness. He has been keeping the area elevated and been performing warm soaks. 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 No known bites. Onset of symptoms acute. Course is worsening. Nothing makes symptoms better. He has had 3 total doses of Bactrim.      Past Medical History:  Diagnosis Date  . Cardiac arrest (HCC)   . Heart failure (HCC)    EF 40-45%  . ICD (implantable cardioverter-defibrillator) in place   . ICD (implantable cardioverter-defibrillator) infection (HCC)   . PUD (peptic ulcer disease)   . Sudden cardiac death [facility](HCC) 2006   Unknown etiology    There are no active problems to display for this patient.   Past Surgical History:  Procedure Laterality Date  . ICD replacement  2016  . PACEMAKER INSERTION    . THORACOTOMY  2016   To facilitate ICD lead removal       Home Medications    Prior to Admission medications   Medication Sig Start Date End Date Taking? Authorizing Provider  cephALEXin (KEFLEX) 500 MG capsule Take 1 capsule (500 mg total) by mouth 4 (four) times daily. 05/10/17   Renne CriglerGeiple, Gizzelle Lacomb, PA-C  famotidine (PEPCID) 20 MG tablet Take 1 tablet (20 mg total) by mouth 2 (two) times  daily. 05/05/17 05/10/17  Joy, Shawn C, PA-C  naproxen sodium (ANAPROX DS) 550 MG tablet Take 1 twice daily as needed for wrist pain. 12/25/11   Molpus, John, MD  pantoprazole (PROTONIX) 20 MG tablet Take 1 tablet (20 mg total) by mouth daily before breakfast. 05/05/17 07/04/17  Joy, Hillard DankerShawn C, PA-C    Family History History reviewed. No pertinent family history.  Social History Social History  Substance Use Topics  . Smoking status: Never Smoker  . Smokeless tobacco: Never Used  . Alcohol use No     Allergies   Codeine and Vecuronium   Review of Systems Review of Systems  Constitutional: Negative for activity change.  Musculoskeletal: Positive for myalgias. Negative for arthralgias, back pain, gait problem, joint swelling and neck pain.  Skin: Positive for color change and wound.  Neurological: Negative for weakness and numbness.     Physical Exam Updated Vital Signs BP (!) 129/93 (BP Location: Right Arm)   Pulse 80   Temp 98.3 F (36.8 C) (Oral)   Resp 18   Ht 6\' 2"  (1.88 m)   Wt 124.7 kg (275 lb)   SpO2 100%   BMI 35.31 kg/m   Physical Exam  Constitutional: He appears well-developed and well-nourished.  HENT:  Head: Normocephalic and atraumatic.  Eyes: Conjunctivae are normal.  Neck: Normal range of motion. Neck supple.  Cardiovascular: Normal pulses.  Exam reveals no  decreased pulses.   Musculoskeletal: He exhibits edema and tenderness.  Patient with redness between the PIP and DIP of the left middle finger dorsally. No full or tenderness or swelling noted. Decreased range of motion due to pain and swelling -- but can flex and extend slightly at both joints. Normal cap refill.   Neurological: He is alert. No sensory deficit.  Motor, sensation, and vascular distal to the injury is fully intact.   Skin: Skin is warm and dry.  Psychiatric: He has a normal mood and affect.  Nursing note and vitals reviewed.      ED Treatments / Results   Procedures Procedures  (including critical care time)  Medications Ordered in ED Medications - No data to display   Initial Impression / Assessment and Plan / ED Course  I have reviewed the triage vital signs and the nursing notes.  Pertinent labs & imaging results that were available during my care of the patient were reviewed by me and considered in my medical decision making (see chart for details).     Patient seen and examined. Soft tissue US performed.   Vital signs reviewed and are as follows: BP (!) 129/93 (BP Location: Right Arm)   Pulse 80   Temp 98.3 F (36.8 C) (Oral)   Resp 18   Ht 6\' 2"  (1.88 m)   Wt 124.7 kg (275 lb)   SpO2 100%   BMI 35.31 kg/m   EMERGENCY DEPARTMENT US SOFT TISSUE INTERPRETATION "Study: Limited Soft Tissue Ultrasound"  INDICATIONS: Soft tissue infection Multiple views of the body part were obtained in real-time with a multi-frequency linear probe  PERFORMED BY: Myself IMAGES ARCHIVED?: Yes SIDE:Left BODY PART:middle finger, between PIP And DIP, dorsally INTERPRETATION:  Cellulitis present  At this point, will add keflex. Offered Vicodin, patient declines. Patient to return in 24-48 hours for recheck. I will be working and can recheck patient.  Encourage return with worsening swelling, redness, streaking up the hand, fever, new symptoms. He verbalizes understanding and agrees with plan.   Final Clinical Impressions(s) / ED Diagnoses   Final diagnoses:  Cellulitis of finger of left hand   Patient with obvious cellulitis and possible small abscess of the dorsal aspect of the left middle finger between the DIP and PIP. Patient has only had 3 doses of Bactrim to this point. He has no lymphangitis. Patient has decreased range of motion but decent range of motion that I do not suspect a septic joint at this point. There are no Kanavel signs to suspect flexor tenosynovitis. I think that this infection requires another 24-48 hours to declare itself. If not  improving, possibly need orthopedic hand evaluation. Will maximize outpatient therapy and recheck in 24-48 hours. No reported bites.    New Prescriptions Discharge Medication List as of 05/10/2017 11:08 AM    START taking these medications   Details  cephALEXin (KEFLEX) 500 MG capsule Take 1 capsule (500 mg total) by mouth 4 (four) times daily., Starting Fri 05/10/2017, Print          Renne Crigler, PA-C 05/10/17 1132    Vanetta Mulders, MD 05/17/17 1719

## 2017-05-10 NOTE — Discharge Instructions (Signed)
Please read and follow all provided instructions.  Your diagnoses today include:  1. Cellulitis of finger of left hand     Tests performed today include:  Vital signs. See below for your results today.   Medications prescribed:   Keflex (cephalexin) - antibiotic  You have been prescribed an antibiotic medicine: take the entire course of medicine even if you are feeling better. Stopping early can cause the antibiotic not to work.  Take any prescribed medications only as directed.   Home care instructions:  Follow any educational materials contained in this packet. Keep affected area above the level of your heart when possible. Wash area gently twice a day with warm soapy water. Do not apply alcohol or hydrogen peroxide. Cover the area if it draining or weeping.   Follow-up instructions: Return to the Emergency Department in 48 hours for a recheck if your symptoms are not significantly improved.   Return instructions:  Return to the Emergency Department if you have:  Fever  Worsening symptoms  Worsening pain  Worsening swelling  Redness of the skin that moves away from the affected area, especially if it streaks away from the affected area   Any other emergent concerns  Your vital signs today were: BP (!) 129/93 (BP Location: Right Arm)    Pulse 80    Temp 98.3 F (36.8 C) (Oral)    Resp 18    Ht 6\' 2"  (1.88 m)    Wt 124.7 kg (275 lb)    SpO2 100%    BMI 35.31 kg/m  If your blood pressure (BP) was elevated above 135/85 this visit, please have this repeated by your doctor within one month. --------------

## 2017-05-11 ENCOUNTER — Encounter (HOSPITAL_BASED_OUTPATIENT_CLINIC_OR_DEPARTMENT_OTHER): Payer: Self-pay | Admitting: Emergency Medicine

## 2017-05-11 ENCOUNTER — Ambulatory Visit (HOSPITAL_BASED_OUTPATIENT_CLINIC_OR_DEPARTMENT_OTHER)
Admission: EM | Admit: 2017-05-11 | Discharge: 2017-05-12 | Disposition: A | Discharge status: Home / Self Care | Payer: Medicaid Other | Attending: Emergency Medicine | Admitting: Emergency Medicine

## 2017-05-11 DIAGNOSIS — L089 Local infection of the skin and subcutaneous tissue, unspecified: Secondary | ICD-10-CM | POA: Diagnosis present

## 2017-05-11 DIAGNOSIS — L02512 Cutaneous abscess of left hand: Secondary | ICD-10-CM | POA: Insufficient documentation

## 2017-05-11 DIAGNOSIS — Z95810 Presence of automatic (implantable) cardiac defibrillator: Secondary | ICD-10-CM | POA: Insufficient documentation

## 2017-05-11 LAB — CBC WITH DIFFERENTIAL/PLATELET
BASOS ABS: 0 10*3/uL (ref 0.0–0.1)
BASOS PCT: 0 %
Eosinophils Absolute: 0.1 10*3/uL (ref 0.0–0.7)
Eosinophils Relative: 1 %
HEMATOCRIT: 40.7 % (ref 39.0–52.0)
Hemoglobin: 14.3 g/dL (ref 13.0–17.0)
LYMPHS PCT: 22 %
Lymphs Abs: 2.7 10*3/uL (ref 0.7–4.0)
MCH: 28.9 pg (ref 26.0–34.0)
MCHC: 35.1 g/dL (ref 30.0–36.0)
MCV: 82.2 fL (ref 78.0–100.0)
MONO ABS: 1.1 10*3/uL — AB (ref 0.1–1.0)
Monocytes Relative: 9 %
NEUTROS PCT: 68 %
Neutro Abs: 8.2 10*3/uL — ABNORMAL HIGH (ref 1.7–7.7)
Platelets: 344 10*3/uL (ref 150–400)
RBC: 4.95 MIL/uL (ref 4.22–5.81)
RDW: 12.8 % (ref 11.5–15.5)
WBC: 12.2 10*3/uL — ABNORMAL HIGH (ref 4.0–10.5)

## 2017-05-11 LAB — BASIC METABOLIC PANEL
ANION GAP: 11 (ref 5–15)
BUN: 17 mg/dL (ref 6–20)
CALCIUM: 9.1 mg/dL (ref 8.9–10.3)
CO2: 22 mmol/L (ref 22–32)
Chloride: 103 mmol/L (ref 101–111)
Creatinine, Ser: 1.14 mg/dL (ref 0.61–1.24)
GLUCOSE: 121 mg/dL — AB (ref 65–99)
POTASSIUM: 3.5 mmol/L (ref 3.5–5.1)
Sodium: 136 mmol/L (ref 135–145)

## 2017-05-11 MED ORDER — FENTANYL CITRATE (PF) 100 MCG/2ML IJ SOLN
50.0000 ug | Freq: Once | INTRAMUSCULAR | Status: AC
  Administered 2017-05-11: 50 ug via INTRAVENOUS
  Filled 2017-05-11: qty 2

## 2017-05-11 MED ORDER — VANCOMYCIN HCL 10 G IV SOLR
2000.0000 mg | Freq: Once | INTRAVENOUS | Status: DC
  Filled 2017-05-11: qty 2000

## 2017-05-11 MED ORDER — VANCOMYCIN HCL IN DEXTROSE 1-5 GM/200ML-% IV SOLN
INTRAVENOUS | Status: AC
  Administered 2017-05-11: 2 g
  Filled 2017-05-11: qty 400

## 2017-05-11 NOTE — ED Notes (Signed)
Pt states he noticed a red bump on his left middle finger about a week ago. Went to UC and they "opened it up with a scalpel and cultured it". Called today and told him he was +MRSA. Presents with red, swollen left middle finger with limited movement.

## 2017-05-11 NOTE — ED Provider Notes (Signed)
[facility] Note   CSN: [id number] Arrival date & time: 05/11/17  2132 By signing my name below, I, Levon HedgerElizabeth Hall, attest that this documentation has been prepared under the direction and in the presence of Benjiman CorePickering, Rishan Oyama, MD . Electronically Signed: Levon HedgerElizabeth Hall, Scribe. 05/11/2017. 10:13 PM.   History   Chief Complaint Chief Complaint  Patient presents with  . Wound Infection   HPI Robert Rivers is a [age] y.o. male who presents to the Emergency Department complaining of gradually worsening, persistent erythema and swelling to his left middle finger onset one week ago. He reports associated severe, throbbing pain to the area that is exacerbated by movement and direct palpation. Pt was seen for the same at urgent care 3 days ago where he was rx Bactrim and here yesterday where he was started on Keflex. Pt received a phone call from an urgent care today stating that his blood cultures were positive for MRSA. He denies any fever or chills and has no other acute complaints at this time.   The history is provided by the patient. No language interpreter was used.   Past Medical History:  Diagnosis Date  . Cardiac arrest (HCC)   . Heart failure (HCC)    EF 40-45%  . ICD (implantable cardioverter-defibrillator) in place   . ICD (implantable cardioverter-defibrillator) infection (HCC)   . PUD (peptic ulcer disease)   . Sudden cardiac death [facility](HCC) 2006   Unknown etiology    There are no active problems to display for this patient.  Past Surgical History:  Procedure Laterality Date  . ICD replacement  2016  . PACEMAKER INSERTION    . THORACOTOMY  2016   To facilitate ICD lead removal    Home Medications    Prior to Admission medications   Medication Sig Start Date End Date Taking? Authorizing Provider  cephALEXin (KEFLEX) 500 MG capsule Take 1 capsule (500 mg total) by mouth 4 (four) times daily. 05/10/17   Renne CriglerGeiple, Joshua, PA-C  famotidine (PEPCID) 20 MG  tablet Take 1 tablet (20 mg total) by mouth 2 (two) times daily. 05/05/17 05/10/17  Joy, Shawn C, PA-C  naproxen sodium (ANAPROX DS) 550 MG tablet Take 1 twice daily as needed for wrist pain. 12/25/11   Molpus, John, MD  pantoprazole (PROTONIX) 20 MG tablet Take 1 tablet (20 mg total) by mouth daily before breakfast. 05/05/17 07/04/17  Joy, Hillard DankerShawn C, PA-C    Family History No family history on file.  Social History Social History  Substance Use Topics  . Smoking status: Never Smoker  . Smokeless tobacco: Never Used  . Alcohol use No   Allergies   Codeine and Vecuronium  Review of Systems Review of Systems  Constitutional: Negative for chills and fever.  Musculoskeletal: Positive for arthralgias, joint swelling and myalgias.  Skin: Positive for color change and wound.   Physical Exam Updated Vital Signs BP 118/86 (BP Location: Right Arm)   Pulse 94   Temp 99.9 F (37.7 C) (Oral)   Resp 16   SpO2 96%   Physical Exam  Constitutional: He is oriented to person, place, and time. He appears well-developed and well-nourished. No distress.  HENT:  Head: Normocephalic and atraumatic.  Eyes: Conjunctivae are normal.  Cardiovascular: Normal rate and regular rhythm.   No tachycardia  Pulmonary/Chest: Effort normal.  Abdominal: He exhibits no distension.  Musculoskeletal:  Left middle finger has swelling over the fiunger, Limited flexion and extension over DIP, PIP and to lesser extent  MCP joint. No drainage. Center scab with surrounding purulence. No tracking up forearm. See photo below.  Neurological: He is alert and oriented to person, place, and time.  Skin: Skin is warm and dry.  Nursing note and vitals reviewed.    ED Treatments / Results  DIAGNOSTIC STUDIES:  Oxygen Saturation is 100% on RA, normal by my interpretation.    COORDINATION OF CARE:  10:09 PM Discussed treatment plan with pt at bedside and pt agreed to plan.   Labs (all labs ordered are listed, but only  abnormal results are displayed) Labs Reviewed  BASIC METABOLIC PANEL - Abnormal; Notable for the following:       Result Value   Glucose, Bld 121 (*)    All other components within normal limits  CBC WITH DIFFERENTIAL/PLATELET - Abnormal; Notable for the following:    WBC 12.2 (*)    Neutro Abs 8.2 (*)    Monocytes Absolute 1.1 (*)    All other components within normal limits    EKG  EKG Interpretation None       Radiology No results found.  Procedures Procedures (including critical care time)  Medications Ordered in ED Medications  vancomycin (VANCOCIN) 2,000 mg in sodium chloride 0.9 % 500 mL IVPB (2,000 mg Intravenous Not Given 05/11/17 2306)  vancomycin (VANCOCIN) 1-5 GM/200ML-% IVPB (2 g  New Bag/Given 05/11/17 2305)  fentaNYL (SUBLIMAZE) injection 50 mcg (50 mcg Intravenous Given 05/11/17 2323)     Initial Impression / Assessment and Plan / ED Course  I have reviewed the triage vital signs and the nursing notes.  Pertinent labs & imaging results that were available during my care of the patient were reviewed by me and considered in my medical decision making (see chart for details).     Patient with finger infection. Has been on Bactrim for 3 days and Keflex for 1 day. Worsening swelling. Discussed with Dr. Merlyn Lot, will get the patient in transfer at [facility] for surgery tonight. Vancomycin will be given first. Will go by private vehicle. Also discussed with Dr. Fredderick Phenix.  Final Clinical Impressions(s) / ED Diagnoses   Final diagnoses:  Finger infection    New Prescriptions New Prescriptions   No medications on file   I personally performed the services described in this documentation, which was scribed in my presence. The recorded information has been reviewed and is accurate.      Benjiman Core, MD 05/12/17 [phone]

## 2017-05-11 NOTE — ED Triage Notes (Signed)
PT presents to ed with c/o wound infection to  middle finger on left hand. PT was here yesterday and urgent care called today and told him the blood cultures were positive for MRSA.

## 2017-05-12 ENCOUNTER — Encounter (HOSPITAL_COMMUNITY): Payer: Self-pay | Admitting: Anesthesiology

## 2017-05-12 ENCOUNTER — Emergency Department (HOSPITAL_COMMUNITY): Payer: Medicaid Other | Admitting: Anesthesiology

## 2017-05-12 ENCOUNTER — Encounter (HOSPITAL_COMMUNITY)
Admission: EM | Disposition: A | Discharge status: Home / Self Care | Payer: Self-pay | Source: Home / Self Care | Attending: Emergency Medicine

## 2017-05-12 DIAGNOSIS — L02512 Cutaneous abscess of left hand: Secondary | ICD-10-CM | POA: Diagnosis not present

## 2017-05-12 DIAGNOSIS — Z95810 Presence of automatic (implantable) cardiac defibrillator: Secondary | ICD-10-CM | POA: Diagnosis not present

## 2017-05-12 HISTORY — PX: I & D EXTREMITY: SHX5045

## 2017-05-12 SURGERY — IRRIGATION AND DEBRIDEMENT EXTREMITY
Anesthesia: General | Site: Finger | Laterality: Left

## 2017-05-12 MED ORDER — LIDOCAINE HCL (CARDIAC) 20 MG/ML IV SOLN
INTRAVENOUS | Status: DC | PRN
  Administered 2017-05-12: 60 mg via INTRAVENOUS

## 2017-05-12 MED ORDER — FENTANYL CITRATE (PF) 250 MCG/5ML IJ SOLN
INTRAMUSCULAR | Status: AC
  Filled 2017-05-12: qty 5

## 2017-05-12 MED ORDER — SULFAMETHOXAZOLE-TRIMETHOPRIM 800-160 MG PO TABS: 1.0000 | ORAL_TABLET | Freq: Two times a day (BID) | ORAL | 0 refills | Status: AC

## 2017-05-12 MED ORDER — LIDOCAINE 2% (20 MG/ML) 5 ML SYRINGE
INTRAMUSCULAR | Status: AC
  Filled 2017-05-12: qty 5

## 2017-05-12 MED ORDER — MEPERIDINE HCL 25 MG/ML IJ SOLN
INTRAMUSCULAR | Status: AC
  Filled 2017-05-12: qty 1

## 2017-05-12 MED ORDER — FENTANYL CITRATE (PF) 100 MCG/2ML IJ SOLN
INTRAMUSCULAR | Status: DC | PRN
  Administered 2017-05-12: 50 ug via INTRAVENOUS
  Administered 2017-05-12: 100 ug via INTRAVENOUS
  Administered 2017-05-12 (×2): 50 ug via INTRAVENOUS

## 2017-05-12 MED ORDER — FENTANYL CITRATE (PF) 100 MCG/2ML IJ SOLN: 25.0000 ug | INTRAMUSCULAR | Status: DC | PRN

## 2017-05-12 MED ORDER — PROPOFOL 10 MG/ML IV BOLUS
INTRAVENOUS | Status: DC | PRN
  Administered 2017-05-12: 30 mg via INTRAVENOUS
  Administered 2017-05-12: 200 mg via INTRAVENOUS

## 2017-05-12 MED ORDER — PROPOFOL 10 MG/ML IV BOLUS
INTRAVENOUS | Status: AC
  Filled 2017-05-12: qty 20

## 2017-05-12 MED ORDER — DEXAMETHASONE SODIUM PHOSPHATE 10 MG/ML IJ SOLN
INTRAMUSCULAR | Status: DC | PRN
  Administered 2017-05-12: 10 mg via INTRAVENOUS

## 2017-05-12 MED ORDER — TRAMADOL HCL 50 MG PO TABS: ORAL_TABLET | ORAL | 0 refills | Status: AC

## 2017-05-12 MED ORDER — ONDANSETRON HCL 4 MG/2ML IJ SOLN
INTRAMUSCULAR | Status: DC | PRN
  Administered 2017-05-12: 4 mg via INTRAVENOUS

## 2017-05-12 MED ORDER — MIDAZOLAM HCL 5 MG/5ML IJ SOLN
INTRAMUSCULAR | Status: DC | PRN
  Administered 2017-05-12: 2 mg via INTRAVENOUS

## 2017-05-12 MED ORDER — MEPERIDINE HCL 25 MG/ML IJ SOLN
6.2500 mg | INTRAMUSCULAR | Status: DC | PRN
  Administered 2017-05-12: 12.5 mg via INTRAVENOUS

## 2017-05-12 MED ORDER — SUCCINYLCHOLINE CHLORIDE 20 MG/ML IJ SOLN
INTRAMUSCULAR | Status: DC | PRN
  Administered 2017-05-12: 120 mg via INTRAVENOUS

## 2017-05-12 MED ORDER — LACTATED RINGERS IV SOLN
INTRAVENOUS | Status: DC | PRN
  Administered 2017-05-12: 03:00:00 via INTRAVENOUS

## 2017-05-12 MED ORDER — SUCCINYLCHOLINE CHLORIDE 200 MG/10ML IV SOSY
PREFILLED_SYRINGE | INTRAVENOUS | Status: AC
  Filled 2017-05-12: qty 10

## 2017-05-12 MED ORDER — BUPIVACAINE HCL (PF) 0.25 % IJ SOLN
INTRAMUSCULAR | Status: AC
  Filled 2017-05-12: qty 30

## 2017-05-12 MED ORDER — 0.9 % SODIUM CHLORIDE (POUR BTL) OPTIME
TOPICAL | Status: DC | PRN
  Administered 2017-05-12: 1000 mL

## 2017-05-12 MED ORDER — BUPIVACAINE HCL (PF) 0.25 % IJ SOLN
INTRAMUSCULAR | Status: DC | PRN
  Administered 2017-05-12: 10 mL

## 2017-05-12 MED ORDER — MIDAZOLAM HCL 2 MG/2ML IJ SOLN
INTRAMUSCULAR | Status: AC
  Filled 2017-05-12: qty 2

## 2017-05-12 SURGICAL SUPPLY — 38 items
BANDAGE ACE 3X5.8 VEL STRL LF (GAUZE/BANDAGES/DRESSINGS) ×3 IMPLANT
BANDAGE ACE 4X5 VEL STRL LF (GAUZE/BANDAGES/DRESSINGS) ×3 IMPLANT
BANDAGE COBAN STERILE 2 (GAUZE/BANDAGES/DRESSINGS) IMPLANT
BNDG COHESIVE 1X5 TAN STRL LF (GAUZE/BANDAGES/DRESSINGS) ×3 IMPLANT
BNDG CONFORM 2 STRL LF (GAUZE/BANDAGES/DRESSINGS) IMPLANT
BNDG ESMARK 4X9 LF (GAUZE/BANDAGES/DRESSINGS) IMPLANT
BNDG GAUZE ELAST 4 BULKY (GAUZE/BANDAGES/DRESSINGS) ×3 IMPLANT
CORDS BIPOLAR (ELECTRODE) ×3 IMPLANT
COVER SURGICAL LIGHT HANDLE (MISCELLANEOUS) ×3 IMPLANT
DRSG ADAPTIC 3X8 NADH LF (GAUZE/BANDAGES/DRESSINGS) IMPLANT
DRSG EMULSION OIL 3X3 NADH (GAUZE/BANDAGES/DRESSINGS) ×3 IMPLANT
DRSG PAD ABDOMINAL 8X10 ST (GAUZE/BANDAGES/DRESSINGS) ×6 IMPLANT
GAUZE PACKING IODOFORM 1/4X15 (GAUZE/BANDAGES/DRESSINGS) ×3 IMPLANT
GAUZE SPONGE 4X4 12PLY STRL (GAUZE/BANDAGES/DRESSINGS) ×3 IMPLANT
GAUZE SPONGE 4X4 12PLY STRL LF (GAUZE/BANDAGES/DRESSINGS) ×3 IMPLANT
GAUZE XEROFORM 1X8 LF (GAUZE/BANDAGES/DRESSINGS) ×3 IMPLANT
GLOVE BIO SURGEON STRL SZ7.5 (GLOVE) ×3 IMPLANT
GLOVE BIOGEL PI IND STRL 8 (GLOVE) ×1 IMPLANT
GLOVE BIOGEL PI INDICATOR 8 (GLOVE) ×2
GOWN STRL REUS W/ TWL LRG LVL3 (GOWN DISPOSABLE) ×1 IMPLANT
GOWN STRL REUS W/TWL LRG LVL3 (GOWN DISPOSABLE) ×2
KIT BASIN OR (CUSTOM PROCEDURE TRAY) ×3 IMPLANT
KIT ROOM TURNOVER OR (KITS) ×3 IMPLANT
NEEDLE 25GAX1.5 (MISCELLANEOUS) ×3 IMPLANT
NEEDLE HYPO 18GX1.5 BLUNT FILL (NEEDLE) ×3 IMPLANT
NEEDLE HYPO 25X1 1.5 SAFETY (NEEDLE) IMPLANT
NS IRRIG 1000ML POUR BTL (IV SOLUTION) ×3 IMPLANT
PACK ORTHO EXTREMITY (CUSTOM PROCEDURE TRAY) ×3 IMPLANT
PAD ARMBOARD 7.5X6 YLW CONV (MISCELLANEOUS) ×6 IMPLANT
SCRUB BETADINE 4OZ XXX (MISCELLANEOUS) ×3 IMPLANT
SOL PREP POV-IOD 4OZ 10% (MISCELLANEOUS) ×3 IMPLANT
SUT ETHILON 4 0 PS 2 18 (SUTURE) ×6 IMPLANT
SWAB CULTURE ESWAB REG 1ML (MISCELLANEOUS) IMPLANT
SYR 10ML LL (SYRINGE) ×3 IMPLANT
TOWEL OR 17X26 10 PK STRL BLUE (TOWEL DISPOSABLE) ×3 IMPLANT
TUBE FEEDING ENTERAL 5FR 16IN (TUBING) IMPLANT
UNDERPAD 30X30 (UNDERPADS AND DIAPERS) ×3 IMPLANT
YANKAUER SUCT BULB TIP NO VENT (SUCTIONS) ×3 IMPLANT

## 2017-05-12 NOTE — Discharge Instructions (Addendum)

## 2017-05-12 NOTE — Op Note (Signed)
[id number] 

## 2017-05-12 NOTE — Anesthesia Preprocedure Evaluation (Signed)
Anesthesia Evaluation  Patient identified by MRN, date of birth, ID band Patient awake    Reviewed: Allergy & Precautions, H&P , NPO status , Patient's Chart, lab work & pertinent test results  Airway Mallampati: I  TM Distance: >3 FB Neck ROM: Full    Dental no notable dental hx. (+) Teeth Intact, Dental Advisory Given   Pulmonary neg pulmonary ROS,    Pulmonary exam normal breath sounds clear to auscultation       Cardiovascular + Cardiac Defibrillator  Rhythm:Regular Rate:Normal     Neuro/Psych negative neurological ROS  negative psych ROS   GI/Hepatic Neg liver ROS, PUD,   Endo/Other  negative endocrine ROS  Renal/GU negative Renal ROS  negative genitourinary   Musculoskeletal   Abdominal   Peds  Hematology negative hematology ROS (+)   Anesthesia Other Findings   Reproductive/Obstetrics negative OB ROS                             Anesthesia Physical Anesthesia Plan  ASA: II and emergent  Anesthesia Plan: General   Post-op Pain Management:    Induction: Intravenous, Rapid sequence and Cricoid pressure planned  PONV Risk Score and Plan: 2 and Ondansetron, Dexamethasone and Propofol  Airway Management Planned:   Additional Equipment:   Intra-op Plan:   Post-operative Plan: Extubation in OR  Informed Consent: I have reviewed the patients History and Physical, chart, labs and discussed the procedure including the risks, benefits and alternatives for the proposed anesthesia with the patient or authorized representative who has indicated his/her understanding and acceptance.   Dental advisory given  Plan Discussed with: CRNA  Anesthesia Plan Comments:         Anesthesia Quick Evaluation

## 2017-05-12 NOTE — ED Notes (Signed)
Dr. Merlyn LotKuzma paged to Nurse 1st [phone].

## 2017-05-12 NOTE — ED Notes (Signed)
Vanc infusing, tolerating well, no changes, "feel better".

## 2017-05-12 NOTE — ED Notes (Signed)
Upon arrival to my room OR called for patient.  All clothing removed.  Consent signed.  Taken to OR.

## 2017-05-12 NOTE — H&P (Signed)
Robert Rivers is an 36 y.o. male.   Chief Complaint: left long finger infection HPI: 36 yo lhd male states he has had issues with left long finger x 1 week.  Started as red spot that he thought was infected hair follicle.  Popped it with expression of purulence twice.  Increased swelling and erythema.  He describes throbbing pain of 10/10 severity.  Worsened with dependent positioning and alleviated with elevation.  No fevers, chills, night sweats.  Case discussed with Dr. Alvino Rivers and his note from 05/12/2017 reviewed. Xrays viewed and interpreted by me: none Labs reviewed: WBC 12.2  Allergies:  Allergies  Allergen Reactions  . Codeine   . Vecuronium     Past Medical History:  Diagnosis Date  . Cardiac arrest ([place])   . Heart failure (HCC)    EF 40-45%  . ICD (implantable cardioverter-defibrillator) in place   . ICD (implantable cardioverter-defibrillator) infection ([place])   . PUD (peptic ulcer disease)   . Sudden cardiac death [facility]) 2005-01-20   Unknown etiology    Past Surgical History:  Procedure Laterality Date  . ICD replacement  01/20/15  . PACEMAKER INSERTION    . THORACOTOMY  20-Jan-2015   To facilitate ICD lead removal    Family History: History reviewed. No pertinent family history.  Social History:   reports that he has never smoked. He has never used smokeless tobacco. He reports that he does not drink alcohol or use drugs.  Medications:  (Not in a hospital admission)  Results for orders placed or performed during the hospital encounter of 05/11/17 (from the past 48 hour(s))  Basic metabolic panel     Status: Abnormal   Collection Time: 05/11/17 10:20 PM  Result Value Ref Range   Sodium 136 135 - 145 mmol/L   Potassium 3.5 3.5 - 5.1 mmol/L   Chloride 103 101 - 111 mmol/L   CO2 22 22 - 32 mmol/L   Glucose, Bld 121 (H) 65 - 99 mg/dL   BUN 17 6 - 20 mg/dL   Creatinine, Ser 1.14 0.61 - 1.24 mg/dL   Calcium 9.1 8.9 - 10.3 mg/dL   GFR calc non Af Amer >60 >60 mL/min    GFR calc Af Amer >60 >60 mL/min    Comment: (NOTE) The eGFR has been calculated using the CKD EPI equation. This calculation has not been validated in all clinical situations. eGFR's persistently <60 mL/min signify possible Chronic Kidney Disease.    Anion gap 11 5 - 15  CBC with Differential     Status: Abnormal   Collection Time: 05/11/17 10:20 PM  Result Value Ref Range   WBC 12.2 (H) 4.0 - 10.5 K/uL   RBC 4.95 4.22 - 5.81 MIL/uL   Hemoglobin 14.3 13.0 - 17.0 g/dL   HCT 40.7 39.0 - 52.0 %   MCV 82.2 78.0 - 100.0 fL   MCH 28.9 26.0 - 34.0 pg   MCHC 35.1 30.0 - 36.0 g/dL   RDW 12.8 11.5 - 15.5 %   Platelets 344 150 - 400 K/uL   Neutrophils Relative % 68 %   Neutro Abs 8.2 (H) 1.7 - 7.7 K/uL   Lymphocytes Relative 22 %   Lymphs Abs 2.7 0.7 - 4.0 K/uL   Monocytes Relative 9 %   Monocytes Absolute 1.1 (H) 0.1 - 1.0 K/uL   Eosinophils Relative 1 %   Eosinophils Absolute 0.1 0.0 - 0.7 K/uL   Basophils Relative 0 %   Basophils Absolute 0.0 0.0 -  0.1 K/uL    No results found.   A comprehensive review of systems was negative. Review of Systems: No fevers, chills, night sweats, chest pain, shortness of breath, nausea, vomiting, diarrhea, constipation, easy bleeding or bruising, headaches, dizziness, vision changes, fainting.   Blood pressure (!) 150/83, pulse 74, temperature 99.9 F (37.7 C), temperature source Oral, resp. rate 16, SpO2 98 %.  General appearance: alert, cooperative and appears stated age Head: Normocephalic, without obvious abnormality, atraumatic Neck: supple, symmetrical, trachea midline Resp: clear to auscultation bilaterally Cardio: regular rate and rhythm GI: non-tender Extremities: Intact sensation and capillary refill all digits.  +epl/fpl/io.  Left long with draining wound dorsally.  Erythema and swelling of digit dorsally over middle phalanx.  Additional erythematous swollen area on proximal phalanx.  No volar tenderness. Pulses: 2+ and  symmetric Skin: Skin color, texture, turgor normal. No rashes or lesions Neurologic: Grossly normal Incision/Wound: As above  Assessment/Plan Left long finger abscess.  Recommend OR for incision and drainage.  Risks, benefits and alternatives of surgery discussed including risks of blood loss, infection, damage to nerves/vessels/tendons/ligament/bone, failure of surgery, need for additional surgery, complication with wound healing, nonunion, malunion, stiffness.  He voiced understanding of these risks and elected to proceed.    Robert Rivers R 05/12/2017, 2:54 AM

## 2017-05-12 NOTE — Brief Op Note (Signed)
05/11/2017 - 05/12/2017  4:07 AM  PATIENT:  Darrick GrinderJason P Buerger  36 y.o. male  PRE-OPERATIVE DIAGNOSIS:  infected finger  POST-OPERATIVE DIAGNOSIS:  Left long finger abscess  PROCEDURE:  Procedure(s): IRRIGATION AND DEBRIDEMENT EXTREMITY (Left)  SURGEON:  Surgeon(s) and Role:    Betha Loa* Brendalee Matthies, MD - Primary  PHYSICIAN ASSISTANT:   ASSISTANTS: none   ANESTHESIA:   general  EBL:  No intake/output data recorded.  BLOOD ADMINISTERED:none  DRAINS: iodoform packing  LOCAL MEDICATIONS USED:  MARCAINE     SPECIMEN:  Source of Specimen:  left long finger  DISPOSITION OF SPECIMEN:  micro  COUNTS:  YES  TOURNIQUET:  Left arm: 20 minutes at 250 mmHg  DICTATION: .Other Dictation: Dictation Number [id number]  PLAN OF CARE: Discharge to home after PACU  PATIENT DISPOSITION:  PACU - hemodynamically stable.

## 2017-05-12 NOTE — Op Note (Signed)
NAMMarland Rivers:  Carlena SaxSHE, Torre                  ACCOUNT NO.:  [id number]  MEDICAL RECORD NO.:  [id number]  LOCATION:  [facility]                          FACILITY:  [facility]  PHYSICIAN:  Betha LoaKevin Rory Montel, MD        DATE OF BIRTH:  1981-09-22  DATE OF PROCEDURE:  05/12/2017 DATE OF DISCHARGE:                              OPERATIVE REPORT   PREOPERATIVE DIAGNOSIS:  Left long finger subcutaneous abscess.  POSTOPERATIVE DIAGNOSIS:  Left long finger subcutaneous abscess.  PROCEDURE:  Incision and drainage, left long finger subcutaneous abscess.  SURGEON:  Betha LoaKevin Amado Andal, MD.  ASSISTANT:  None.  ANESTHESIA:  General.  IV FLUIDS:  Per anesthesia flow sheet.  ESTIMATED BLOOD LOSS:  Minimal.  COMPLICATIONS:  None.  SPECIMENS:  Cultures to Micro.  TOURNIQUET TIME:  20 minutes at 250 mmHg.  DISPOSITION:  Stable to PACU.  INDICATIONS:  Mr. Robert Rivers is a 36 year old male who has been having increasing pain, swelling, and erythema of the left long finger for several days.  He has a draining wound.  I recommended incision and drainage in the operating room.  Risks, benefits, and alternatives of surgery were discussed including risk of blood loss; infection; damage to nerves, vessels, tendons, ligaments, bone; failure of surgery; need for additional surgery; complications with wound healing; continued pain; recurrence of infection, need for repeat irrigation and debridement.  He voiced understanding of these risks and elected to proceed.  OPERATIVE COURSE:  After being identified preoperatively by myself, the patient and I agreed upon the procedure and site of the procedure. Surgical site was marked.  The risks, benefits, and alternatives of surgery were reviewed, and he wished to proceed.  Surgical consent had been signed.  [facility]  He was transported to the operating room and placed on the operating room table in supine position with the left upper extremity on arm  board. General anesthesia was induced by anesthesiologist.  The left upper extremity was prepped and draped in normal sterile orthopedic fashion. Surgical pause was performed between surgeons, Anesthesia, and operating room staff; and all were in agreement as to the patient, procedure, and site of procedure.  Tourniquet at the proximal aspect of the extremity was inflated to 250 mmHg after exsanguination of the limb with an Esmarch bandage.  Incision was made over the dorsum of the left long finger including the draining portion of the wound.  This was carried into subcutaneous tissues by spreading technique.  There was gross purulence.  Cultures were taken for aerobes, anaerobes, and Gram stain. The wound was extended proximally.  An additional incision was made over the proximal phalanx where there was a swollen erythematous area.  There was thin fluid in this area.  The purulence tracked proximally along the extensor tendon toward the radial side.  The purulence was removed with pickups and rongeur and Ray-Tec sponge.  The draining portion of the wound was sharply excised with the knife blade.  The 2 wounds were able to communicate.  Once all purulence had been removed, the wound was copiously irrigated with sterile saline.  There did not appear to be a rent in  the tendon or infection of the PIP joint.  The wound was copiously irrigated with sterile saline and then packed with quarter- inch iodoform gauze.  A single nylon suture was placed at the apex of the wound over the middle phalanx to limit wound edge retraction.  The skin edges were not reapproximated.  Wounds were dressed with sterile 4x4s and wrapped with a Coban dressing lightly.  AlumaFoam splint was placed and wrapped lightly with Coban dressing.  A digital block was performed with 10 mL of 0.25% plain Marcaine to aid in postoperative analgesia.  Tourniquet was deflated approximately 20 minutes. Fingertips were pink with  brisk capillary refill after deflation of tourniquet.  Operative drapes were broken down.  The patient was awoken from anesthesia safely.  He was transferred back to stretcher and taken to PACU in stable condition.  I will see him back in the office in the beginning of this week for postoperative followup.  I will give him a prescription for tramadol 50 mg 1-2 p.o. q.6 hours p.r.n. pain, dispensed #20.  He states hydrocodone and oxycodone did not work well with him.  He will also continue Bactrim DS 1 p.o. b.i.d. x7 days.     Betha Loa, MD     KK/MEDQ  D:  05/12/2017  T:  05/12/2017  Job:  [id number]

## 2017-05-12 NOTE — ED Provider Notes (Signed)
2:05 AM [facility]. Prior MD note [facility] by Dr. Merlyn LotKuzma. Dr. Merlyn LotKuzma consulted to does confirm [facility]. This has been explained to patient. Apologies expressed. Patient to go via POV. Charge RN, Irving BurtonEmily, [facility] been made aware of patient arrival.   Antony MaduraHumes, Sevag Shearn, Cordelia Poche-C 05/12/17 [id number]

## 2017-05-12 NOTE — Anesthesia Procedure Notes (Signed)
Procedure Name: Intubation Date/Time: 05/12/2017 3:29 AM Performed by: Manus Gunning, Dale Ribeiro J Pre-anesthesia Checklist: Patient identified, Emergency Drugs available, Suction available, Patient being monitored and Timeout performed Patient Re-evaluated:Patient Re-evaluated prior to inductionOxygen Delivery Method: Circle system utilized Preoxygenation: Pre-oxygenation with 100% oxygen Intubation Type: IV induction and Rapid sequence Laryngoscope Size: Mac and 3 Grade View: Grade II Tube type: Oral Tube size: 7.0 mm Number of attempts: 1 Airway Equipment and Method: Stylet Placement Confirmation: ETT inserted through vocal cords under direct vision,  positive ETCO2 and breath sounds checked- equal and bilateral Secured at: 22 cm Tube secured with: Tape Dental Injury: Teeth and Oropharynx as per pre-operative assessment

## 2017-05-12 NOTE — Transfer of Care (Signed)
Immediate Anesthesia Transfer of Care Note  Patient: Robert Rivers  Procedure(s) Performed: Procedure(s): IRRIGATION AND DEBRIDEMENT EXTREMITY (Left)  Patient Location: PACU  Anesthesia Type:General  Level of Consciousness: awake  Airway & Oxygen Therapy: Patient Spontanous Breathing  Post-op Assessment: Report given to RN and Post -op Vital signs reviewed and stable  Post vital signs: Reviewed and stable  Last Vitals:  Vitals:   05/12/17 0244 05/12/17 0431  BP: (!) 150/83   Pulse: 74 (!) 115  Resp: 16   Temp:  36.3 C    Last Pain:  Vitals:   05/12/17 0301  TempSrc:   PainSc: 2          Complications: No apparent anesthesia complications

## 2017-05-12 NOTE — ED Provider Notes (Signed)
2:40 AM  [facility] Dr. Merlyn LotKuzma for left third finger abscess and cellulitis on the dorsal aspect of the hand. He is neurovascular intact distally. No systemic symptoms. Has previous history of cardiac arrest and has a defibrillator. Otherwise no acute medical problems. Patient is left-hand-dominant. Dr. Merlyn LotKuzma immediately at bedside upon patient arrival to taken to the operating room for incision and drainage.   Clovis Mankins, Layla MawKristen N, DO 05/12/17 [phone]

## 2017-05-12 NOTE — Anesthesia Postprocedure Evaluation (Signed)
Anesthesia Post Note  Patient: Darrick GrinderJason P Viles  Procedure(s) Performed: Procedure(s) (LRB): IRRIGATION AND DEBRIDEMENT EXTREMITY (Left)     Patient location during evaluation: PACU Anesthesia Type: General Level of consciousness: awake and alert Pain management: pain level controlled Vital Signs Assessment: post-procedure vital signs reviewed and stable Respiratory status: spontaneous breathing, nonlabored ventilation and respiratory function stable Cardiovascular status: blood pressure returned to baseline and stable Postop Assessment: no signs of nausea or vomiting Anesthetic complications: no    Last Vitals:  Vitals:   05/12/17 0500 05/12/17 0502  BP: 131/62 125/67  Pulse: (!) 101 99  Resp: 15 17  Temp:  36.7 C    Last Pain:  Vitals:   05/12/17 0301  TempSrc:   PainSc: 2                  Karna Abed,W. EDMOND

## 2017-05-13 ENCOUNTER — Encounter (HOSPITAL_COMMUNITY): Payer: Self-pay | Admitting: Orthopedic Surgery

## 2017-05-17 LAB — AEROBIC/ANAEROBIC CULTURE W GRAM STAIN (SURGICAL/DEEP WOUND)

## 2017-05-17 LAB — AEROBIC/ANAEROBIC CULTURE (SURGICAL/DEEP WOUND)

## 2017-05-28 ENCOUNTER — Encounter (HOSPITAL_COMMUNITY): Payer: Self-pay | Admitting: Orthopedic Surgery

## 2017-05-28 NOTE — OR Nursing (Signed)
Addendum created to reflect correct out of phase II and Procedure Care Complete times

## 2017-08-02 IMAGING — US US ABDOMEN LIMITED
1 series · 14 of 25 positions shown · non-contrast
Comparison: None.

CLINICAL DATA: Epigastric and right upper quadrant abdominal pain
for several days

EXAM:
ULTRASOUND ABDOMEN LIMITED RIGHT UPPER QUADRANT

[Series 1: us abdomen limited · 0.24mm/px · 14 of 52 slices shown]
[im 1/52]
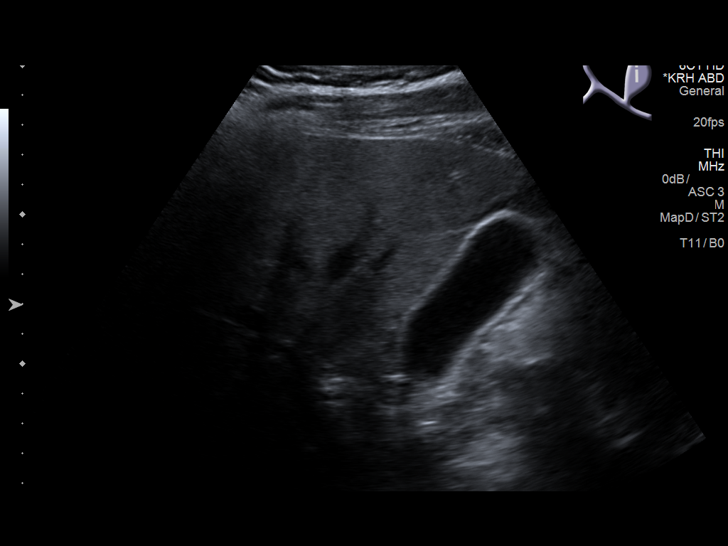
[im 5/52]
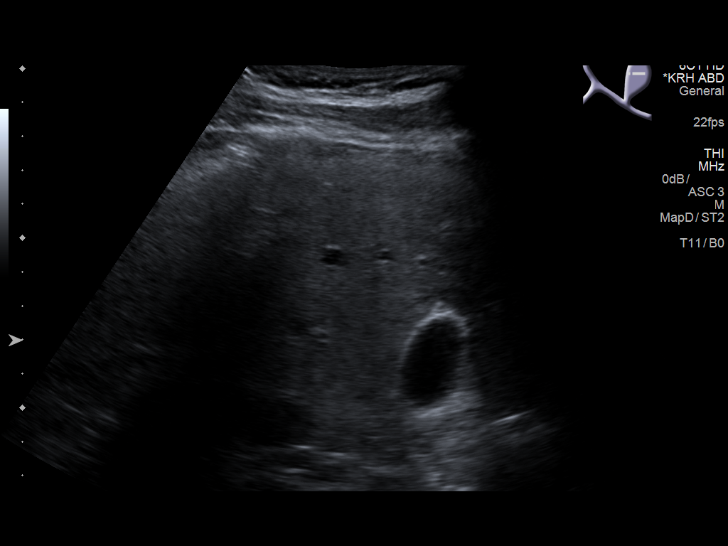
[im 9/52]
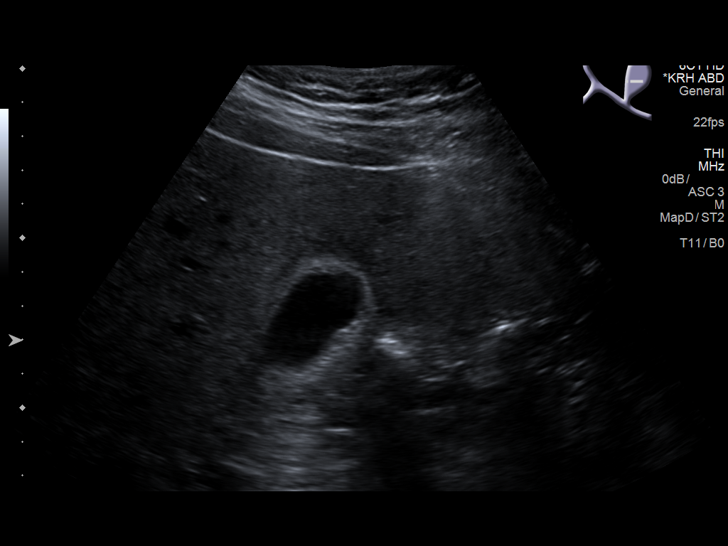
[im 13/52]
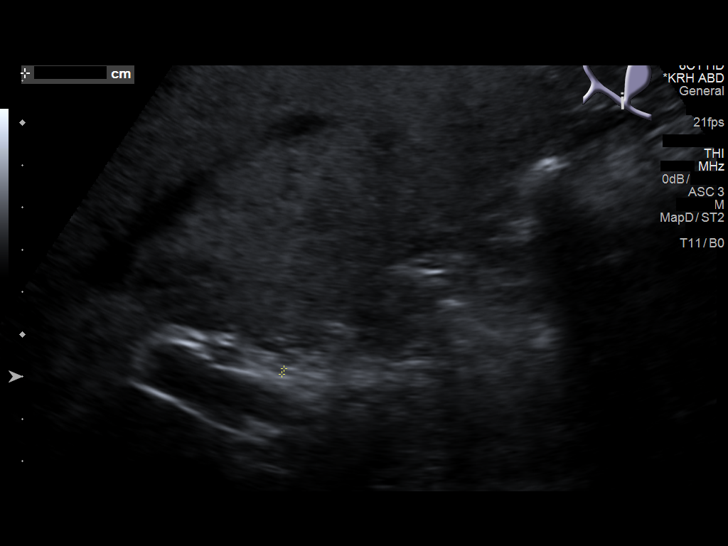
[im 18/52]
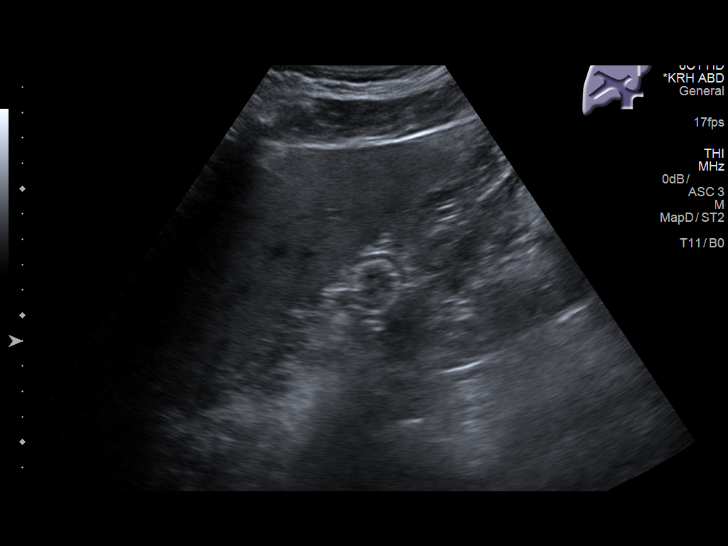
[im 20/52]
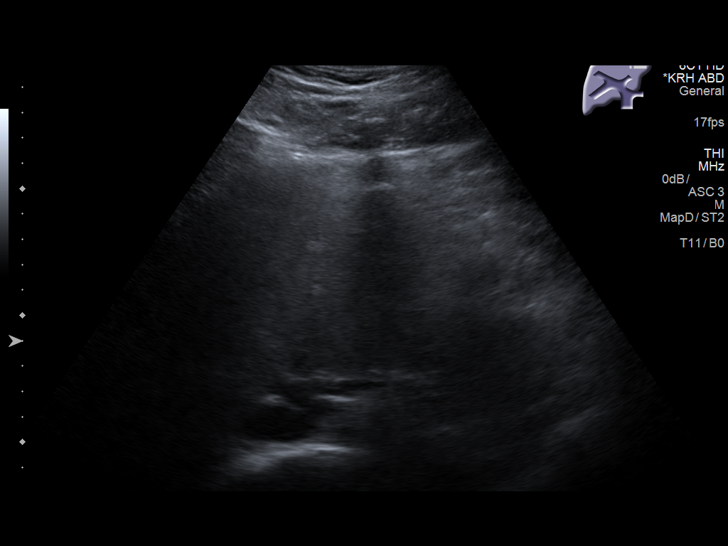
[im 24/52]
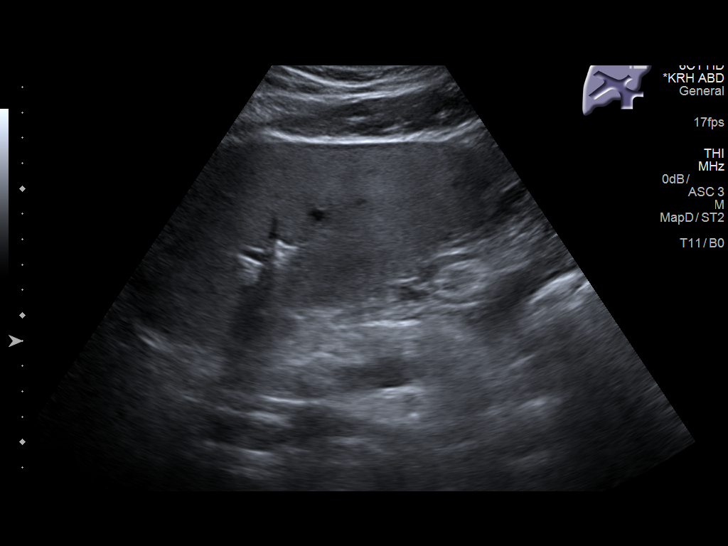
[im 28/52]
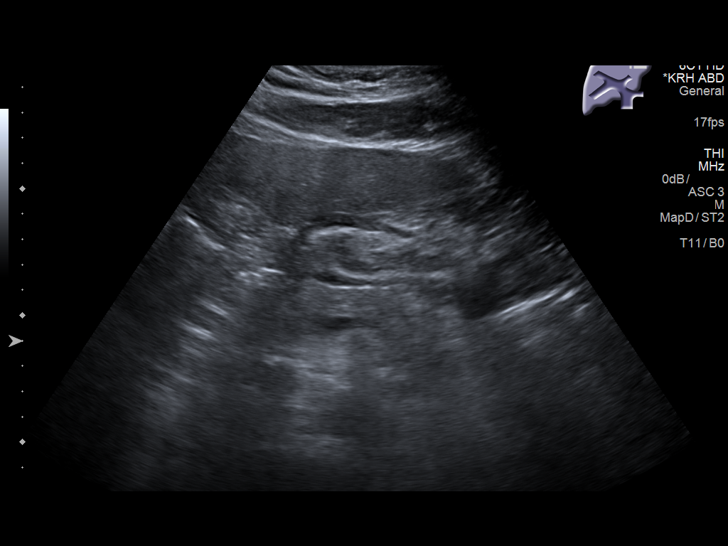
[im 32/52]
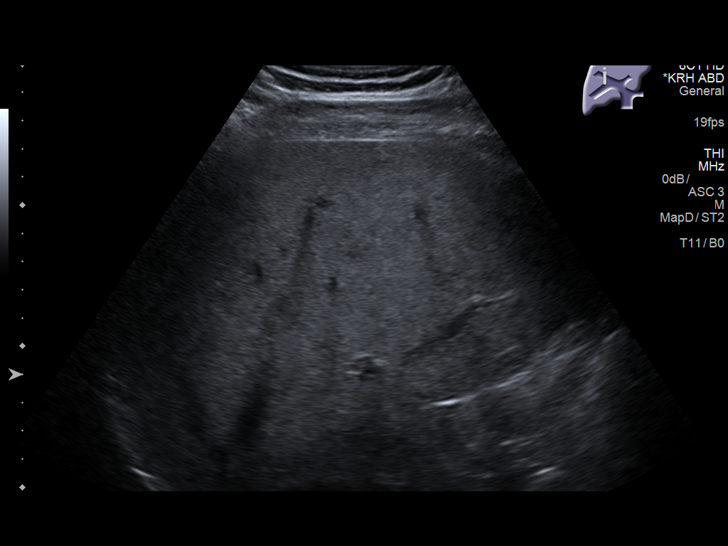
[im 35/52]
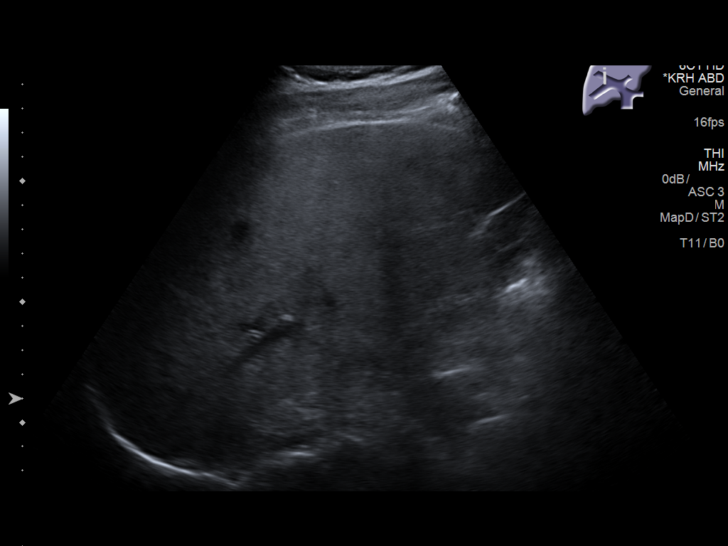
[im 39/52]
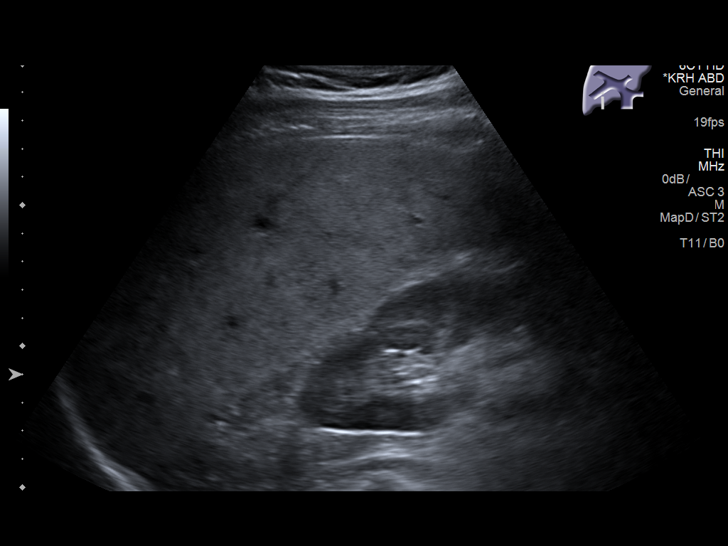
[im 43/52]
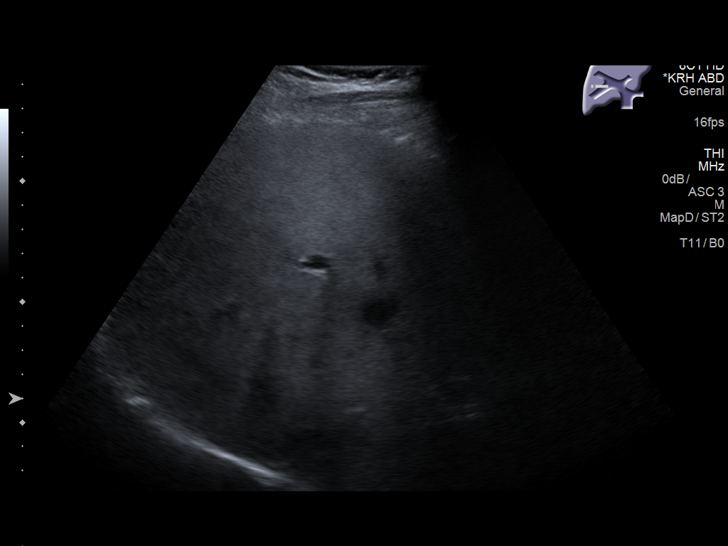
[im 47/52]
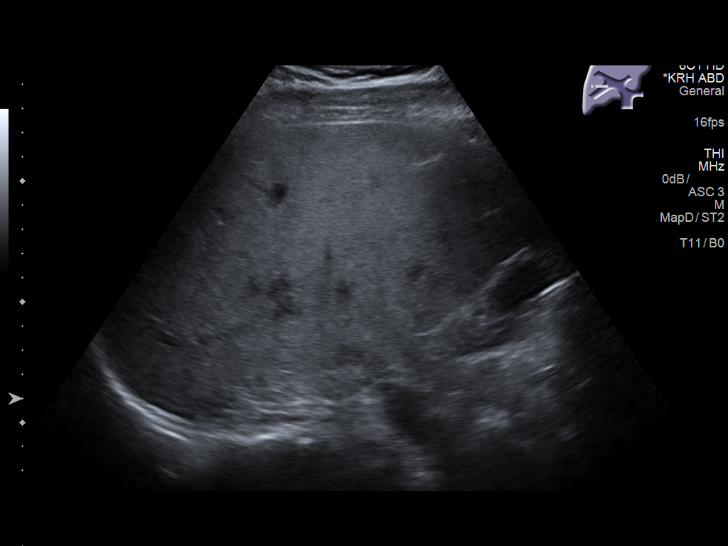
[im 52/52]
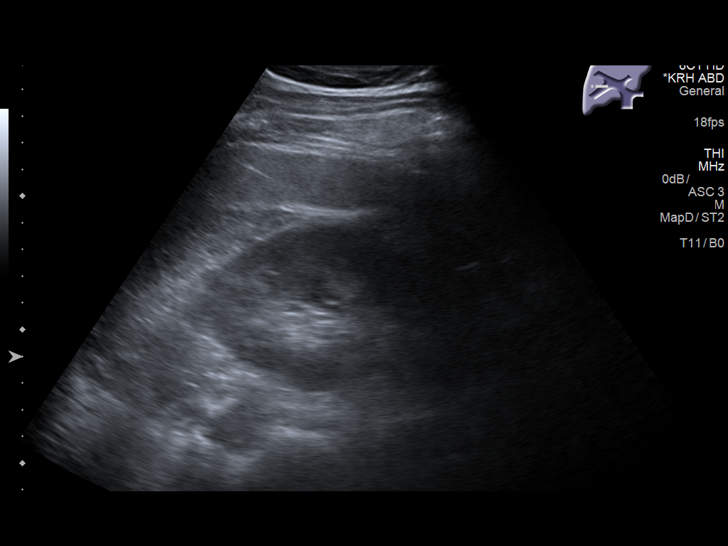

[14 of 25 positions shown; findings below may reference images not displayed]

FINDINGS: Gallbladder:

No gallstones or wall thickening visualized. No sonographic Murphy
sign noted by sonographer.

Common bile duct:

Diameter: 3 mm

Liver:

Liver parenchyma is diffusely mildly to moderately echogenic with
posterior acoustic attenuation, compatible with mild-to-moderate
diffuse hepatic steatosis. No definite liver surface irregularity.
No liver mass detected, noting decreased sensitivity in the setting
of an echogenic liver.
IMPRESSION: 1. Mild-to-moderate diffuse hepatic steatosis.
2. Normal gallbladder, with no cholelithiasis.
3. No biliary ductal dilatation.

## 2023-08-30 ENCOUNTER — Ambulatory Visit: Payer: Self-pay | Admitting: Surgery

## 2023-08-30 NOTE — H&P (Signed)
Robert Rivers [id number]   Referring Provider:  Jolly Mango, MD   Subjective   Chief Complaint: New Consultation ( Umbilical hernia)     History of Present Illness:    Very pleasant 42 year old man with history of sudden cardiac arrest at age 4, heart failure with an EF of 40-45%, ICD in place and peptic ulcer disease who presents for evaluation of an umbilical hernia.  He notes a bulge at the umbilicus and pain.  This does not radiate.  The pain is sharp.  He has known about it for quite some time, and also would like to have his abdominal wall assessed as he has noted some protrusion in the upper midline.  Previous abdominal surgery.   Review of Systems: A complete review of systems was obtained from the patient.  I have reviewed this information and discussed as appropriate with the patient.  See HPI as well for other ROS.   Medical History: Past Medical History:  Diagnosis Date   Arthritis    Asthma, unspecified asthma severity, unspecified whether complicated, unspecified whether persistent (HHS-HCC)    CHF (congestive heart failure) (CMS/HHS-HCC)     There is no problem list on file for this patient.   Past Surgical History:  Procedure Laterality Date   INSERTION SINGLE CHAMBER PACEMAKER GENERATOR       Allergies  Allergen Reactions   Codeine Vomiting    No current outpatient medications on file prior to visit.   No current facility-administered medications on file prior to visit.    No family history on file.   Social History   Tobacco Use  Smoking Status Never  Smokeless Tobacco Never     Social History   Socioeconomic History   Marital status: Married  Tobacco Use   Smoking status: Never   Smokeless tobacco: Never  Vaping Use   Vaping status: Never Used  Substance and Sexual Activity   Alcohol use: Not Currently   Drug use: Never    Objective:    Vitals:   08/30/23 1505  BP: 119/80  Pulse: 80  Temp: 37 C (98.6 F)  SpO2: 96%   Weight: (!) 132.9 kg (293 lb)  Height: 188 cm (6\' 2" )  PainSc: 0-No pain    Body mass index is 37.62 kg/m.  Gen: A&Ox3, no distress  Unlabored respiration Abdomen is soft, nondistended, nontender; he has an upper midline diastases approximately 3 fingerbreadths in width.  He does have a reduced umbilical hernia, fascial defect is 1 cm by palpation  Assessment and Plan:  Diagnoses and all orders for this visit:  Umbilical hernia without obstruction and without gangrene    I discussed the relevant anatomy and options for repair.  I recommend and reviewed an open repair possibly with mesh depending on fascial defect size at the time of surgery.  We discussed the technique of the surgery and went over risks including bleeding, infection, pain, scarring, injury to intra-abdominal structures, wound healing problems, seroma or hematoma, hernia recurrence, as well as cardiovascular, pulmonary and thromboembolic complications.  We also discussed the diastases and etiology of this, discussed physical therapy or core strengthening exercises that may be helpful.  Questions were welcomed and answered to his satisfaction.  He would like to proceed.  We will request cardiac clearance.  Saffron Busey Carlye Grippe, MD

## 2024-04-08 NOTE — Progress Notes (Signed)
 Orthopedic Surgery Office Note  Chief Complaint  Patient presents with  . Follow-up    Right hip     History of Present Illness: 43 year old male presents to clinic several years of right hip pain he previously received a corticosteroid injection done on May 2 and got complete pain relief for several days.  He states the pain is returned and is looking for further treatment options  Prior Treatment: Right hip intra-articular injection, oral anti-inflammatories, home physical therapy physician directed  History and Physical standard intake form from today's or previous visit is reviewed, pertinent information is noted and otherwise scanned into the patient's permanent medical record for future use.    Review of Systems:  Negative unless otherwise specified in HPI   Physical Exam:   General:  Alert and oriented x 3, NAD, well kempt, and of stated age.   Vital Signs:  BP 126/86 (BP Location: Right arm, Patient Position: Sitting)   Pulse 85   Ht 1.88 m (6' 2)   Wt 132 kg (290 lb)   BMI 37.23 kg/m  , Body mass index is 37.23 kg/m.   HEENT:  Head is normocephalic and atraumatic. Extraocular muscles are grossly intact. Pupils are equal, round, and reactive to light and accommodation. Nares appeared normal. Mucous membranes are moist. Neck:  Supple, no visible swelling. Cardiovascular:  Hemodynamically stable. Respiratory:  No audible wheezing, unlabored respirations. Abdomen:  Soft, non-distended. Neurological:  Cranial nerves II through XII appear grossly intact.  Skin:  Clean and dry, well kempt. Psychiatric:  Good affect, stable, cooperative Extremities:  Pink without cyanosis, bilaterally.  Orthopedic Exam:   Right hip was examined and appears normal and symmetric. Patients gait is antalgic Skin without rashes or ulceration. Neurovascularly intact distally with 5/5 extensor hallucis longus, flexor hallucis longus, tibialis anterior and gastrocnemius. Foot with warm and well  perfused with good pulses distally. Sensation is fully intact.  Intact in the deep peroneal, superficial peroneal, sural, saphenous, and tibial nerve distributions. non-tender over greater trochanter. Good muscle strength and tone Reduced range of motion due to pain and stiffness Logroll positive   Assessment:    Discussed the imaging and physical exam findings with the patient consistent with endstage hip degenerative disease. The patient has failed conservative management listed above.  The risks, benefits and alternatives to total right hip replacement were discussed with the patient in detail and the patient elected to proceed with surgery. Discussed the surgical plan with the patient including implant options and surgical approach.   Surgical risks including fracture requiring fixation, instability/dislocation, temporary or permanent leg length inequality, infection, bleeding, stiffness, failure to alleviate pain, chronic pain injury to nerves resulting in extremity dysfunction, injury to arteries and veins, deep vein thrombosis or pulmonary embolism requiring anticoagulation and medical risk factors including heart attack, stroke, pneumonia, kidney or other organ failure were discussed with the patient.    Plan:    Recommend a right total hip replacement anterior approach  Medical and Social reviewed: Obesity  Follow up: Scheduled for surgery  Test Results X-rays of the right hip were personally reviewed by me shows moderate degenerative changes of the right hip with joint space collapse subchondral sclerosing acetabular cystic formation as well as a cam lesion with likely labral pathology   Labs  Lab Results  Component Value Date   WBC 7.5 12/08/2021   HGB 15.3 12/08/2021   HCT 44.2 12/08/2021   PLT 302 12/08/2021    Lab Results  Component Value Date  NA 138 12/08/2021   K 4.4 12/08/2021   CL 103 12/08/2021   CO2 25 12/08/2021   BUN 22 12/08/2021   CREATININE 0.79  12/08/2021   CALCIUM 9.5 12/08/2021    No results found for: BILITOT, BILIDIR, PROT, ALBUMIN, ALT, AST, GGT  No results found for: LABPROT, INR, APTT  Allergies  Vecuronium, Adhesive, Codeine, and Oxycodone   Medications    Current Outpatient Medications  Medication Sig Dispense Refill  . albuterol HFA (PROVENTIL HFA;VENTOLIN HFA;PROAIR HFA) 90 mcg/actuation inhaler Inhale 2 puffs every 6 (six) hours as needed for wheezing. 1 Inhaler 1  . EPINEPHrine (EPIPEN) 0.3 mg/0.3 mL injection syringe Inject 0.3 mg into the muscle once as needed for anaphylaxis for up to 1 dose. 1 each 2  . fluticasone propionate (FLONASE) 50 mcg/spray nasal spray Administer 1 spray into each nostril Once Daily.    . methylPREDNISolone (MEDROL DOSEPAK) 4 mg 6 day dose pack Take As Directed On Package 21 tablet 0   No current facility-administered medications for this visit.     Past Medical History  Past medical history reviewed and otherwise negative unless stated below.  Past Medical History:  Diagnosis Date  . Asthma (CMD)   . Cardiac defibrillator in place   . Heartburn   . Obesity   . Respiratory failure    (CMD) 04/03/2013     Past Surgical History  Past surgical history reviewed and otherwise negative unless stated below.  Past Surgical History:  Procedure Laterality Date  . CARDIAC DEFIBRILLATOR PLACEMENT     Procedure: CARDIAC DEFIBRILLATOR PLACEMENT  . CARDIAC DEFIBRILLATOR PLACEMENT  09/21/2015   Procedure: EP SUB Q ICD;    . CARDIAC PACEMAKER PLACEMENT  2006, 2014   Procedure: CARDIAC PACEMAKER PLACEMENT  . HAND SURGERY Left 05/12/2017   Procedure: HAND SURGERY; LMF Foreign Body removal  . HAND SURGERY Left 05/12/2017   Procedure: HAND SURGERY; I & D  . THORACOTOMY Right 09/16/2015   Procedure: THORACOTOMY;  Surgeon: Yancy Patriciaann Herder, MD;  Location[facility] MAIN OR;  Service: Cardiothoracic;  Laterality: Right;  . WISDOM TOOTH EXTRACTION     Procedure: WISDOM  TOOTH EXTRACTION     Family History  Family history reviewed and otherwise negative unless stated below.  Family History  Problem Relation Name Age of Onset  . Hypertension Father    . Anesthesia problems Neg Hx       Social History:  Social history reviewed and otherwise negative unless stated below.  Social History   Socioeconomic History  . Marital status: Married    Spouse name: Not on file  . Number of children: Not on file  . Years of education: Not on file  . Highest education level: Not on file  Occupational History  . Not on file  Tobacco Use  . Smoking status: Never  . Smokeless tobacco: Never  Substance and Sexual Activity  . Alcohol use: No  . Drug use: No  . Sexual activity: Not on file    Comment: tubal   Other Topics Concern  . Not on file  Social History Narrative  . Not on file   Social Drivers of Health   Food Insecurity: Not on file  Transportation Needs: Not on file  Safety: Low Risk  (03/11/2024)   Safety   . How often does anyone, including family and friends, physically hurt you?: Never   . How often does anyone, including family and friends, insult or talk down to you?: Never   .  How often does anyone, including family and friends, threaten you with harm?: Never   . How often does anyone, including family and friends, scream or curse at you?: Never  Living Situation: Not on file         Problem List  Patient Active Problem List  Diagnosis  . History of cardiac arrest  . Concentration deficit  . Asthma  . Status post thoracotomy  . Pain in finger of left hand  . Cardiac defibrillator in situ  . Deviated nasal septum  . Long Q-T syndrome  . Diastasis of rectus abdominis  . Strain of left psoas muscle  . Primary osteoarthritis of right hip

## 2024-05-25 ENCOUNTER — Ambulatory Visit: Admitting: Orthopaedic Surgery

## 2024-09-21 ENCOUNTER — Encounter: Payer: Self-pay | Admitting: Radiology
